# Patient Record
Sex: Female | Born: 1985 | Race: White | Hispanic: No | Marital: Married | Smoking: Former smoker
Health system: Southern US, Community
[De-identification: ages and names within clinical notes are randomized; demographics above are authoritative.]

## PROBLEM LIST (undated history)

## (undated) ENCOUNTER — Inpatient Hospital Stay (HOSPITAL_COMMUNITY): Payer: Self-pay

## (undated) DIAGNOSIS — O00109 Unspecified tubal pregnancy without intrauterine pregnancy: Secondary | ICD-10-CM

## (undated) HISTORY — DX: Unspecified tubal pregnancy without intrauterine pregnancy: O00.109

---
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
## 2011-05-01 ENCOUNTER — Inpatient Hospital Stay (HOSPITAL_COMMUNITY)
Admission: AD | Admit: 2011-05-01 | Discharge: 2011-05-02 | Disposition: A | Discharge status: Home / Self Care | Payer: BC Managed Care – PPO | Source: Ambulatory Visit | Attending: Obstetrics & Gynecology | Admitting: Obstetrics & Gynecology

## 2011-05-01 ENCOUNTER — Inpatient Hospital Stay (HOSPITAL_COMMUNITY): Payer: BC Managed Care – PPO

## 2011-05-01 ENCOUNTER — Encounter (HOSPITAL_COMMUNITY): Payer: Self-pay | Admitting: *Deleted

## 2011-05-01 ENCOUNTER — Ambulatory Visit (INDEPENDENT_AMBULATORY_CARE_PROVIDER_SITE_OTHER): Payer: BC Managed Care – PPO | Admitting: Family Medicine

## 2011-05-01 VITALS — BP 102/65 | HR 100 | Temp 98.6°F | Resp 18 | Ht 65.5 in | Wt 131.0 lb

## 2011-05-01 DIAGNOSIS — N912 Amenorrhea, unspecified: Secondary | ICD-10-CM

## 2011-05-01 DIAGNOSIS — O209 Hemorrhage in early pregnancy, unspecified: Secondary | ICD-10-CM

## 2011-05-01 DIAGNOSIS — O00109 Unspecified tubal pregnancy without intrauterine pregnancy: Secondary | ICD-10-CM | POA: Insufficient documentation

## 2011-05-01 LAB — CBC
HCT: 33.7 % — ABNORMAL LOW (ref 36.0–46.0)
Hemoglobin: 11.2 g/dL — ABNORMAL LOW (ref 12.0–15.0)
MCH: 30.5 pg (ref 26.0–34.0)
MCHC: 33.2 g/dL (ref 30.0–36.0)
MCV: 91.8 fL (ref 78.0–100.0)
Platelets: 266 10*3/uL (ref 150–400)
RBC: 3.67 MIL/uL — ABNORMAL LOW (ref 3.87–5.11)
RDW: 12.1 % (ref 11.5–15.5)
WBC: 6.2 10*3/uL (ref 4.0–10.5)

## 2011-05-01 LAB — HCG, QUANTITATIVE, PREGNANCY: hCG, Beta Chain, Quant, S: 106 m[IU]/mL — ABNORMAL HIGH

## 2011-05-01 LAB — ABO/RH: ABO/RH(D): A POS

## 2011-05-01 LAB — POCT URINE PREGNANCY: Preg Test, Ur: POSITIVE

## 2011-05-01 MED ORDER — METRONIDAZOLE 500 MG PO TABS: 500.0000 mg | ORAL_TABLET | Freq: Two times a day (BID) | ORAL | Status: DC

## 2011-05-01 MED ORDER — DOXYCYCLINE HYCLATE 100 MG PO TABS: 100.0000 mg | ORAL_TABLET | Freq: Two times a day (BID) | ORAL | Status: DC

## 2011-05-01 NOTE — MAU Note (Signed)
Pt states she went to MD's office to confirm pregnancy test , because she also had some pelvic pain

## 2011-05-01 NOTE — MAU Note (Signed)
Pt sent over from urgent care with positive preg test and bleeding.

## 2011-05-01 NOTE — MAU Provider Note (Signed)
Chief Complaint:  Abdominal Pain   Sheila Salinas is  26 y.o. G1P0.  Patient's last menstrual period was 03/31/2011.Marland Kitchen  Her pregnancy status is positive.   She presents complaining of Abdominal Pain  Pt was sent from PCP secondary to confirmed urine pregnancy in office with vaginal spotting and occasional mild cramping  Obstetrical/Gynecological History: OB History    Grav Para Term Preterm Abortions TAB SAB Ect Mult Living   1               Past Medical History: No past medical history on file.  Past Surgical History: No past surgical history on file.  Family History: No family history on file.  Social History: History  Substance Use Topics  . Smoking status: Former Games developer  . Smokeless tobacco: Not on file  . Alcohol Use: Not on file    Allergies:  Allergies  Allergen Reactions  . Morphine And Related Nausea And Vomiting    Prescriptions prior to admission  Medication Sig Dispense Refill  . norethindrone (NORA-BE) 0.35 MG tablet Take 1 tablet by mouth daily.      . Prenatal Vit-Fe Fumarate-FA (PRENATAL MULTIVITAMIN) TABS Take 1 tablet by mouth daily.        Review of Systems - Negative except what has been reviewed in HPI  Physical Exam   Blood pressure 105/65, pulse 83, temperature 98.8 F (37.1 C), resp. rate 18, height 5' 5.5" (1.664 m), weight 131 lb (59.421 kg), last menstrual period 03/31/2011.  General: General appearance - alert, well appearing, and in no distress, oriented to person, place, and time and normal appearing weight Mental status - alert, oriented to person, place, and time, normal mood, behavior, speech, dress, motor activity, and thought processes, affect appropriate to mood Focused Gynecological Exam: examination not indicated  Labs: Recent Results (from the past 24 hour(s))  POCT URINE PREGNANCY   Collection Time   05/01/11  7:33 PM      Component Value Range   Preg Test, Ur Positive    CBC   Collection Time   05/01/11  8:30 PM    Component Value Range   WBC 6.2  4.0 - 10.5 (K/uL)   RBC 3.67 (*) 3.87 - 5.11 (MIL/uL)   Hemoglobin 11.2 (*) 12.0 - 15.0 (g/dL)   HCT 16.1 (*) 09.6 - 46.0 (%)   MCV 91.8  78.0 - 100.0 (fL)   MCH 30.5  26.0 - 34.0 (pg)   MCHC 33.2  30.0 - 36.0 (g/dL)   RDW 04.5  40.9 - 81.1 (%)   Platelets 266  150 - 400 (K/uL)  HCG, QUANTITATIVE, PREGNANCY   Collection Time   05/01/11  8:30 PM      Component Value Range   hCG, Beta Chain, Quant, S 106 (*) <5 (mIU/mL)  ABO/RH   Collection Time   05/01/11  8:30 PM      Component Value Range   ABO/RH(D) A POS    COMPREHENSIVE METABOLIC PANEL   Collection Time   05/01/11  8:30 PM      Component Value Range   Sodium 136  135 - 145 (mEq/L)   Potassium 3.9  3.5 - 5.1 (mEq/L)   Chloride 103  96 - 112 (mEq/L)   CO2 26  19 - 32 (mEq/L)   Glucose, Bld 103 (*) 70 - 99 (mg/dL)   BUN 13  6 - 23 (mg/dL)   Creatinine, Ser 9.14  0.50 - 1.10 (mg/dL)   Calcium 78.2  8.4 -  10.5 (mg/dL)   Total Protein 6.5  6.0 - 8.3 (g/dL)   Albumin 4.0  3.5 - 5.2 (g/dL)   AST 15  0 - 37 (U/L)   ALT 11  0 - 35 (U/L)   Alkaline Phosphatase 35 (*) 39 - 117 (U/L)   Total Bilirubin 0.2 (*) 0.3 - 1.2 (mg/dL)   GFR calc non Af Amer >90  >90 (mL/min)   GFR calc Af Amer >90  >90 (mL/min)   Imaging Studies:  US Ob Comp Less 14 Wks  05/01/2011  *RADIOLOGY REPORT*  Clinical Data: Bleeding  OBSTETRIC <14 WK [place] AND TRANSVAGINAL OB US  Technique:  Both transabdominal and transvaginal ultrasound examinations were performed for complete evaluation of the gestation as well as the maternal uterus, adnexal regions, and pelvic cul-de-sac.  Transvaginal technique was performed to assess early pregnancy.  Comparison:  None.  Intrauterine gestational sac:  Not visualized Yolk sac: Not visualized Embryo: Not visualized Cardiac Activity: Not applicable Heart Rate: Not applicable bpm  Maternal uterus/adnexae: The right adnexa is within normal limits as the right ovary has a normal appearance.  Within the  left adnexa, the left ovary is within normal limits containing a corpus luteum cyst.  Separate from the left ovary and adjacent, there is a 9 mm structure consisting of an echogenic rim and hypoechoic center.  This finding is suspicious for a tubal ring.  There is no significant vascularity on power Doppler imaging.  Trace free fluid.  Within the uterus, there is no gestational sac but the endometrium is thickened and heterogeneous.  IMPRESSION: There is no intrauterine gestational sac.  The endometrium is heterogeneous and thickened.  There is a structure within the left adnexa is separate from the ovary, 9 mm which may represent a tubal ring from ectopic pregnancy however this cannot be confirmed.  This is an indeterminate finding.  Follow-up beta HCG levels and imaging is recommended in 2- 3 days.  Original Report Authenticated By: Donavan Burnet, M.D.    Assessment: Highly Suspicious for Ectopic Pregnancy  Plan: Patient was counselled regarding [place] highly suspicious for ectopic pregnancy and risk for rupture. Pregnancy unplanned but desired. Management options reviewed as MTX treatment now or close observation of quant in 48 hour. Pt and husband discussed privately and decided to receive MTX tonight in MAU.   MTX given. Precautions reviewed and Pt to FU on Sunday for repeat quant.  Amazin Pincock E. 05/02/2011,12:31 AM

## 2011-05-01 NOTE — Progress Notes (Addendum)
  Patient Name: Sheila Salinas Date of Birth: December 25, 1985 Medical Record Number: [id number] Gender: female Date of Encounter: 05/01/2011  History of Present Illness:  Sheila Salinas is a 26 y.o. very pleasant female patient who presents with the following:  She has taken 2 home pregnancy tests and both were positive.  She took the home tests earlier this week- she has been taking an OCP regularly and stopped taking her pills when she had the positive test.  Her last period was sometime last month but she is really not sure when.  She has noted some mild cramping "like she might get her period" over the last 2 or 3 days. She also noted a "peach" color on the tissue when she wiped the last 2 days- however she has not noted any true bleeding.  This is her 2nd pregnancy- her last was 4 years ago- I did not ask her if she had a miscarriage or TAB as she seemed embarrassed to talk about it.  She is generally healthy.    She is here today with her husband- they were not really planning a pregnancy but they are pleased.    There is no problem list on file for this patient.  No past medical history on file. No past surgical history on file. History  Substance Use Topics  . Smoking status: Former Games developer  . Smokeless tobacco: Not on file  . Alcohol Use: Not on file   No family history on file. Allergies  Allergen Reactions  . Morphine And Related    Medication list has been reviewed and updated.  Review of Systems: As per HPI- otherwise negative.  Physical Examination: Filed Vitals:   05/01/11 1917  BP: 102/65  Pulse: 100  Temp: 98.6 F (37 C)  TempSrc: Oral  Resp: 18  Height: 5' 5.5" (1.664 m)  Weight: 131 lb (59.421 kg)    Body mass index is 21.47 kg/(m^2).  GEN: WDWN, NAD, Non-toxic, A & O x 3 HEENT: Atraumatic, Normocephalic. Neck supple. No masses, No LAD. Ears and Nose: No external deformity. CV: RRR, No M/G/R. No JVD. No thrill. No extra heart sounds. PULM: CTA B, no  wheezes, crackles, rhonchi. No retractions. No resp. distress. No accessory muscle use. ABD: S, NT, ND. No rebound. No HSM.  No palpable uterus GU: normal external genitalia.  Speculum exam reveals dark blood in vaginal vault, cervix visually closed.   EXTR: No c/c/e NEURO Normal gait.  PSYCH: Normally interactive. Conversant. Not depressed or anxious appearing.  Calm demeanor.    Results for orders placed in visit on 05/01/11  POCT URINE PREGNANCY      Component Value Range   Preg Test, Ur Positive      Assessment and Plan: 1. Amenorrhea  POCT urine pregnancy  2. Bleeding in early pregnancy     Sheila Salinas has bleeding in early pregnancy.  After a phone consultation with NP at [facility] decided to have her evaluated to rule- out ectopic and assess her current pregnancy. Her husband will drive her there- given directions  Reviewed [facility] chart- she actually did have an ectopic pregnancy and was given injection of methotrexate.  Called her today to check on her- she is doing ok, will call if we can do anything to help.

## 2011-05-02 LAB — COMPREHENSIVE METABOLIC PANEL
ALT: 11 U/L (ref 0–35)
AST: 15 U/L (ref 0–37)
Albumin: 4 g/dL (ref 3.5–5.2)
Alkaline Phosphatase: 35 U/L — ABNORMAL LOW (ref 39–117)
CO2: 26 mEq/L (ref 19–32)
Chloride: 103 mEq/L (ref 96–112)
Creatinine, Ser: 0.65 mg/dL (ref 0.50–1.10)
GFR calc non Af Amer: >90 mL/min (ref >90)
Potassium: 3.9 mEq/L (ref 3.5–5.1)
Total Bilirubin: 0.2 mg/dL — ABNORMAL LOW (ref 0.3–1.2)

## 2011-05-02 MED ORDER — METHOTREXATE INJECTION FOR WOMEN'S HOSPITAL
50.0000 mg/m2 | Freq: Once | INTRAMUSCULAR | Status: AC
  Administered 2011-05-02: 85 mg via INTRAMUSCULAR
  Filled 2011-05-02: qty 1.7

## 2011-05-02 NOTE — Progress Notes (Signed)
MTX injection reviewed with pt. Pt given comfort pillow and resource information. Pt and husband acknowledged same and hugs exchanged.

## 2011-05-02 NOTE — MAU Provider Note (Signed)
Attestation of Attending Supervision of Resident: Evaluation and management procedures were performed by the [facility] Resident under my supervision.  I have reviewed the resident's note and chart, and I agree with management and plan.  Jaynie Collins, M.D. 05/02/2011 12:53 AM

## 2011-05-02 NOTE — Discharge Instructions (Signed)
Methotrexate Treatment for an Ectopic Pregnancy  An ectopic pregnancy is when the fertilized egg attaches (implants) outside the uterus. Most ectopic pregnancies occur in the fallopian tube. Rarely do ectopic pregnancies occur on the ovary, intestine, pelvis, or cervix. An ectopic pregnancy does not have the ability to develop into a normal, healthy baby. Having an ectopic pregnancy can be a life-threatening experience. However, if the ectopic pregnancy is found early enough, it can be treated with a medicine. This medicine is called methotrexate. Methotrexate works by stopping the pregnancy from growing. It helps the body absorb the pregnancy tissue over a 2 to 6 week period (though most pregnancies will be absorbed by 3 weeks).   If methotrexate is successful, there is a good chance that the fallopian tube may be saved. Regardless of whether the fallopian tube is saved, a mother who has had an ectopic pregnancy is at a much higher risk of having another ectopic occur in future pregnancies. One serious concern is the potential for the fallopian tube to tear (rupture). If it does, emergency surgery is needed to remove the pregnancy, and methotrexate cannot be used.  The ideal patient for methotrexate is a person who is:    Not bleeding internally.   Has no severe or persistent abdominal pain.   Is committed to following through with lab tests and appointments until the ectopic has absorbed.   Is healthy and has normal liver and kidney functions on evaluation.  Methotrexate should not be given to women who:   Are breastfeeding.   Have a normal pregnancy (intrauterine pregnancy).   Have liver, lung, or kidney disease.   Have blood problems.   Are allergic to methotrexate.   Have peptic ulcers.   Have an ectopic pregnancy larger than 1 inches (3.5 cm) or one that has fetal heartbeats. This is a rule that is followed most of the time (relative contraindication).  BEFORE THE TREATMENT  Before giving the  medicine:   Liver tests, kidney tests, and a complete blood test are performed.   Blood tests are performed to measure the pregnancy hormone levels and to determine the mother's blood type.   If the woman is Rh negative, and the father is Rh positive or his Rh type is not known, a RhoGAM shot is given.  TREATMENT   There are 2 methods that your caregiver may use to prescribe methotrexate. One method involves a single dose or injection of the medicine. Another method involves a series of doses. This method involves several injections.   AFTER THE TREATMENT  Blood tests will be taken for several weeks to check the pregnancy hormone levels. The blood tests are performed until there is no more pregnancy hormone detected in the blood. There is still a risk of the ectopic pregnancy rupturing while using the methotrexate. There are also side effects of methotrexate, which include:    Nausea and vomiting.   Mouth sores.   Diarrhea.   Rash.   Dizziness.   Increased abdominal pain.   Increased vaginal bleeding or spotting.   Pneumonia.   Failed treatment.   Hair loss. This is rare and reversible.  On very rare occasions, the medicine may affect your blood counts, liver, kidney, bone marrow, or hormone levels. If this happens, your caregiver will want to perform further evaluations.  Document Released: 01/28/2001 Document Revised: 01/23/2011 Document Reviewed: 10/10/2010  Exit[employer] Patient Information 2012 [employer].

## 2011-05-04 ENCOUNTER — Inpatient Hospital Stay (HOSPITAL_COMMUNITY): Admit: 2011-05-04 | Payer: Self-pay

## 2011-05-06 ENCOUNTER — Inpatient Hospital Stay (HOSPITAL_COMMUNITY)
Admission: AD | Admit: 2011-05-06 | Discharge: 2011-05-06 | Disposition: A | Discharge status: Home / Self Care | Payer: BC Managed Care – PPO | Source: Ambulatory Visit | Attending: Obstetrics and Gynecology | Admitting: Obstetrics and Gynecology

## 2011-05-06 ENCOUNTER — Encounter: Payer: Self-pay | Admitting: Physician Assistant

## 2011-05-06 DIAGNOSIS — O00109 Unspecified tubal pregnancy without intrauterine pregnancy: Secondary | ICD-10-CM

## 2011-05-06 DIAGNOSIS — Z8759 Personal history of other complications of pregnancy, childbirth and the puerperium: Secondary | ICD-10-CM

## 2011-05-06 DIAGNOSIS — Z8742 Personal history of other diseases of the female genital tract: Secondary | ICD-10-CM

## 2011-05-06 HISTORY — DX: Unspecified tubal pregnancy without intrauterine pregnancy: O00.109

## 2011-05-06 NOTE — MAU Note (Signed)
Patient to MAU for BHCG s/p MTX. MTX was given on 3-14 and patient was unable to return on day 4, is also unable to return tomorrow for day 7 and labs drawn today. Reports some cramping on and off but none now and continues to have period like bleeding.

## 2011-05-06 NOTE — Discharge Instructions (Signed)
Methotrexate Treatment for an Ectopic Pregnancy  An ectopic pregnancy is when the fertilized egg attaches (implants) outside the uterus. Most ectopic pregnancies occur in the fallopian tube. Rarely do ectopic pregnancies occur on the ovary, intestine, pelvis, or cervix. An ectopic pregnancy does not have the ability to develop into a normal, healthy baby. Having an ectopic pregnancy can be a life-threatening experience. However, if the ectopic pregnancy is found early enough, it can be treated with a medicine. This medicine is called methotrexate. Methotrexate works by stopping the pregnancy from growing. It helps the body absorb the pregnancy tissue over a 2 to 6 week period (though most pregnancies will be absorbed by 3 weeks).   If methotrexate is successful, there is a good chance that the fallopian tube may be saved. Regardless of whether the fallopian tube is saved, a mother who has had an ectopic pregnancy is at a much higher risk of having another ectopic occur in future pregnancies. One serious concern is the potential for the fallopian tube to tear (rupture). If it does, emergency surgery is needed to remove the pregnancy, and methotrexate cannot be used.  The ideal patient for methotrexate is a person who is:    Not bleeding internally.   Has no severe or persistent abdominal pain.   Is committed to following through with lab tests and appointments until the ectopic has absorbed.   Is healthy and has normal liver and kidney functions on evaluation.  Methotrexate should not be given to women who:   Are breastfeeding.   Have a normal pregnancy (intrauterine pregnancy).   Have liver, lung, or kidney disease.   Have blood problems.   Are allergic to methotrexate.   Have peptic ulcers.   Have an ectopic pregnancy larger than 1 inches (3.5 cm) or one that has fetal heartbeats. This is a rule that is followed most of the time (relative contraindication).  BEFORE THE TREATMENT  Before giving the  medicine:   Liver tests, kidney tests, and a complete blood test are performed.   Blood tests are performed to measure the pregnancy hormone levels and to determine the mother's blood type.   If the woman is Rh negative, and the father is Rh positive or his Rh type is not known, a RhoGAM shot is given.  TREATMENT   There are 2 methods that your caregiver may use to prescribe methotrexate. One method involves a single dose or injection of the medicine. Another method involves a series of doses. This method involves several injections.   AFTER THE TREATMENT  Blood tests will be taken for several weeks to check the pregnancy hormone levels. The blood tests are performed until there is no more pregnancy hormone detected in the blood. There is still a risk of the ectopic pregnancy rupturing while using the methotrexate. There are also side effects of methotrexate, which include:    Nausea and vomiting.   Mouth sores.   Diarrhea.   Rash.   Dizziness.   Increased abdominal pain.   Increased vaginal bleeding or spotting.   Pneumonia.   Failed treatment.   Hair loss. This is rare and reversible.  On very rare occasions, the medicine may affect your blood counts, liver, kidney, bone marrow, or hormone levels. If this happens, your caregiver will want to perform further evaluations.  Document Released: 01/28/2001 Document Revised: 01/23/2011 Document Reviewed: 10/10/2010  Exit[employer] Patient Information 2012 [employer].

## 2011-05-06 NOTE — MAU Provider Note (Signed)
History   Chief Complaint:  Follow-up  Sheila Salinas is  26 y.o. G1P0 Patient's last menstrual period was 03/31/2011.Marland Kitchen Patient is here for follow up of quantitative HCG and ongoing surveillance of pregnancy status following MTX.     Since her last visit, the patient is without new complaint.   The patient reports bleeding as  none now.    General ROS:  negative  Her previous Quantitative HCG values are: 3/14: 104     Physical Exam   Blood pressure 106/64, pulse 88, temperature 98 F (36.7 C), temperature source Oral, resp. rate 16, last menstrual period 03/31/2011.  Focused Gynecological Exam: examination not indicated  Labs: Recent Results (from the past 24 hour(s))  HCG, QUANTITATIVE, PREGNANCY   Collection Time   05/06/11  9:05 AM      Component Value Range   hCG, Beta Chain, Quant, S 83 (*) <5 (mIU/mL)    Ultrasound Studies:   US Ob Comp Less 14 Wks  05/01/2011  *RADIOLOGY REPORT*  Clinical Data: Bleeding  OBSTETRIC <14 WK [place] AND TRANSVAGINAL OB US  Technique:  Both transabdominal and transvaginal ultrasound examinations were performed for complete evaluation of the gestation as well as the maternal uterus, adnexal regions, and pelvic cul-de-sac.  Transvaginal technique was performed to assess early pregnancy.  Comparison:  None.  Intrauterine gestational sac:  Not visualized Yolk sac: Not visualized Embryo: Not visualized Cardiac Activity: Not applicable Heart Rate: Not applicable bpm  Maternal uterus/adnexae: The right adnexa is within normal limits as the right ovary has a normal appearance.  Within the left adnexa, the left ovary is within normal limits containing a corpus luteum cyst.  Separate from the left ovary and adjacent, there is a 9 mm structure consisting of an echogenic rim and hypoechoic center.  This finding is suspicious for a tubal ring.  There is no significant vascularity on power Doppler imaging.  Trace free fluid.  Within the uterus, there is no gestational  sac but the endometrium is thickened and heterogeneous.  IMPRESSION: There is no intrauterine gestational sac.  The endometrium is heterogeneous and thickened.  There is a structure within the left adnexa is separate from the ovary, 9 mm which may represent a tubal ring from ectopic pregnancy however this cannot be confirmed.  This is an indeterminate finding.  Follow-up beta HCG levels and imaging is recommended in 2- 3 days.  Original Report Authenticated By: Donavan Burnet, M.D.   Assessment:  Ectopic Pregnancy s/o MTX Appropriately falling quants  Plan: The patient is instructed to follow up in in 7 days. In Gyn clinic.  Appt made in Gyn Clinic for 05/13/10 @ 8:30am (lab only)  Puja Caffey E. 05/06/2011, 9:39 AM

## 2011-05-13 ENCOUNTER — Other Ambulatory Visit: Payer: BC Managed Care – PPO

## 2011-05-13 DIAGNOSIS — O021 Missed abortion: Secondary | ICD-10-CM

## 2011-05-13 DIAGNOSIS — Z8759 Personal history of other complications of pregnancy, childbirth and the puerperium: Secondary | ICD-10-CM

## 2011-05-19 ENCOUNTER — Telehealth: Payer: Self-pay | Admitting: *Deleted

## 2011-05-19 NOTE — Telephone Encounter (Signed)
Called pt and left message that she will need another hormone level drawn this week. Please call to schedule.

## 2011-05-22 NOTE — Telephone Encounter (Signed)
Called pt and left additional message that although her previous hormone level had decreased, she needs repeat hormone level drawn this week. Please call to schedule appt. She may leave a message on nurse voice mail if she has questions.

## 2011-05-26 NOTE — MAU Provider Note (Signed)
Agree with above note.  Sheila Salinas 05/26/2011 10:22 AM   

## 2011-09-29 ENCOUNTER — Ambulatory Visit (INDEPENDENT_AMBULATORY_CARE_PROVIDER_SITE_OTHER): Payer: 59 | Admitting: Physician Assistant

## 2011-09-29 VITALS — BP 97/64 | HR 73 | Temp 98.3°F | Resp 18 | Ht 65.0 in | Wt 126.6 lb

## 2011-09-29 DIAGNOSIS — S91309A Unspecified open wound, unspecified foot, initial encounter: Secondary | ICD-10-CM

## 2011-09-29 DIAGNOSIS — Z23 Encounter for immunization: Secondary | ICD-10-CM

## 2011-09-29 NOTE — Progress Notes (Signed)
  Subjective:    Patient ID: Sheila Salinas, female    DOB: 01/27/1986, 26 y.o.   MRN: [id number]  HPI  26 year old female presents for Tdap vaccination. States on 8/10 she stepped on the head of a nail while walking barefoot.  She says it did break the skin and is still slightly sore, but otherwise healing well.  Denies any erythema, warmth, or drainage.  Is slightly painful to touch.      Review of Systems  All other systems reviewed and are negative.       Objective:   Physical Exam  Constitutional: She is oriented to person, place, and time. She appears well-developed and well-nourished.  HENT:  Head: Normocephalic and atraumatic.  Right Ear: External ear normal.  Left Ear: External ear normal.  Eyes: Conjunctivae are normal.  Neck: Normal range of motion.  Cardiovascular: Normal rate.   Pulmonary/Chest: Effort normal.  Neurological: She is alert and oriented to person, place, and time.  Skin:       Well healing wound on plantar surface of right foot. No erythema, warmth, or drainage.    Psychiatric: She has a normal mood and affect. Her behavior is normal. Judgment and thought content normal.          Assessment & Plan:   1. Need for Tdap vaccination  Tdap vaccine greater than or equal to 7yo IM  Will update tetanus today.  Wound healing well - no need for antibiotic therapy at this time. Follow up with any worsening or changing symptoms.

## 2012-01-20 ENCOUNTER — Other Ambulatory Visit: Payer: Self-pay

## 2012-02-07 ENCOUNTER — Telehealth: Payer: Self-pay

## 2012-02-07 NOTE — Telephone Encounter (Signed)
Pt has questions about her pregnancy. Please call [phone]

## 2012-02-08 NOTE — Telephone Encounter (Signed)
lmom to cb. 

## 2012-02-12 NOTE — Telephone Encounter (Signed)
Called patient , unable to leave message

## 2012-02-13 NOTE — Telephone Encounter (Signed)
Called again 3rd attempt. Left another message if she needs anything to call me back.

## 2012-02-18 NOTE — L&D Delivery Note (Addendum)
Delivery Note At 7:35 PM a viable and healthy female was delivered after 4 hours in the second stage via Kiwi vacuum extractor (Presentation: Left Occiput Anterior).  APGAR: 9, 9; weight 8 lbs 9 oz.   Placenta status: Intact, Spontaneous.  Cord: 3 vessels.  Anesthesia: Local for repair of lacerations  Episiotomy: None Lacerations: Right labia minora and Left vaginal/perineal tears repaired. Suture Repair: 3.0 chromic Est. Blood Loss (mL): 380  Mom to postpartum.  Baby to nursery-stable.  Shelby Peltz D 07/30/2012, 8:30 PM

## 2012-05-06 ENCOUNTER — Other Ambulatory Visit (HOSPITAL_COMMUNITY): Payer: Self-pay | Admitting: Obstetrics and Gynecology

## 2012-05-06 DIAGNOSIS — O2690 Pregnancy related conditions, unspecified, unspecified trimester: Secondary | ICD-10-CM

## 2012-05-13 ENCOUNTER — Ambulatory Visit (HOSPITAL_COMMUNITY): Admission: RE | Admit: 2012-05-13 | Payer: 59 | Source: Ambulatory Visit

## 2012-05-13 ENCOUNTER — Ambulatory Visit (HOSPITAL_COMMUNITY)
Admission: RE | Admit: 2012-05-13 | Discharge: 2012-05-13 | Disposition: A | Discharge status: Home / Self Care | Payer: 59 | Source: Ambulatory Visit | Attending: Obstetrics and Gynecology | Admitting: Obstetrics and Gynecology

## 2012-05-13 ENCOUNTER — Encounter (HOSPITAL_COMMUNITY): Payer: Self-pay

## 2012-05-13 VITALS — BP 114/66 | HR 110 | Wt 147.8 lb

## 2012-05-13 DIAGNOSIS — Z1389 Encounter for screening for other disorder: Secondary | ICD-10-CM | POA: Insufficient documentation

## 2012-05-13 DIAGNOSIS — Z363 Encounter for antenatal screening for malformations: Secondary | ICD-10-CM | POA: Insufficient documentation

## 2012-05-13 DIAGNOSIS — O352XX0 Maternal care for (suspected) hereditary disease in fetus, not applicable or unspecified: Secondary | ICD-10-CM | POA: Insufficient documentation

## 2012-05-13 DIAGNOSIS — O358XX0 Maternal care for other (suspected) fetal abnormality and damage, not applicable or unspecified: Secondary | ICD-10-CM | POA: Insufficient documentation

## 2012-05-13 DIAGNOSIS — O2690 Pregnancy related conditions, unspecified, unspecified trimester: Secondary | ICD-10-CM

## 2012-05-13 NOTE — Progress Notes (Signed)
Sheila Salinas  was seen today for an ultrasound appointment.  See full report in [facility].  Comments: [name] was seen today due to a family history of congenital heart defects.  She reports that her sisted died during the first year of life due to hypoplastic left heart and pulmonary atresia.  The fetal anatomic survey today was within normal limits.  The 4CH, outflow tracts, aortic and ductal arches appeared normal.  The patient was offerred a formal fetal echo with Peds cardiology as well as Genetics counseling and declined.  Impression: Single IUP at 281/7 weeks Normal detailed fetal anatomy The fetal heart was well-visualized and appears normal Fetal growth is appropriate (74th %tile) Normal amniotic fluid volume  Recommendations: Follow-up ultrasounds as clinically indicated.  Alpha Gula, MD

## 2012-06-03 ENCOUNTER — Encounter (HOSPITAL_COMMUNITY): Payer: Self-pay | Admitting: *Deleted

## 2012-06-03 ENCOUNTER — Inpatient Hospital Stay (HOSPITAL_COMMUNITY)
Admission: AD | Admit: 2012-06-03 | Discharge: 2012-06-03 | Disposition: A | Discharge status: Home / Self Care | Payer: 59 | Source: Ambulatory Visit | Attending: Obstetrics and Gynecology | Admitting: Obstetrics and Gynecology

## 2012-06-03 DIAGNOSIS — O99891 Other specified diseases and conditions complicating pregnancy: Secondary | ICD-10-CM | POA: Insufficient documentation

## 2012-06-03 DIAGNOSIS — N949 Unspecified condition associated with female genital organs and menstrual cycle: Secondary | ICD-10-CM | POA: Insufficient documentation

## 2012-06-03 DIAGNOSIS — O26899 Other specified pregnancy related conditions, unspecified trimester: Secondary | ICD-10-CM

## 2012-06-03 DIAGNOSIS — O26893 Other specified pregnancy related conditions, third trimester: Secondary | ICD-10-CM

## 2012-06-03 DIAGNOSIS — N898 Other specified noninflammatory disorders of vagina: Secondary | ICD-10-CM

## 2012-06-03 LAB — WET PREP, GENITAL
Clue Cells Wet Prep HPF POC: NONE SEEN
Trich, Wet Prep: NONE SEEN

## 2012-06-03 NOTE — MAU Provider Note (Signed)
  History     CSN: [id number]  Arrival date and time: 06/03/12 1257   First Provider Initiated Contact with Patient 06/03/12 1344      Chief Complaint  Patient presents with  . rule of rupture of membraines    HPI  Pt is a G2P0010 here at 31.1 wks IUP here with report of clear watery discharge at around 1200 today.  Discharge soaked through pants today x 1.  History of having increased vaginal discharge, but usually thicker in nature.  No report of vaginal bleeding.  +braxton hicks that started yesterday.    Past Medical History  Diagnosis Date  . Ectopic pregnancy, tubal 05/06/2011    Tx'd with MTX in MAU    History reviewed. No pertinent past surgical history.  History reviewed. No pertinent family history.  History  Substance Use Topics  . Smoking status: Former Games developer  . Smokeless tobacco: Never Used  . Alcohol Use: Yes     Comment: ocial    Allergies:  Allergies  Allergen Reactions  . Morphine And Related Nausea And Vomiting    Prescriptions prior to admission  Medication Sig Dispense Refill  . calcium carbonate (TUMS - DOSED IN MG ELEMENTAL CALCIUM) 500 MG chewable tablet Chew 1 tablet by mouth 3 (three) times daily as needed for heartburn.      Tery Sanfilippo Sodium (COLACE PO) Take 1 tablet by mouth at bedtime.      . Prenatal Vit-Fe Fumarate-FA (PRENATAL MULTIVITAMIN) TABS Take 1 tablet by mouth daily at 12 noon.        Review of Systems  Gastrointestinal: Negative for abdominal pain.  Genitourinary:       Watery discharge  All other systems reviewed and are negative.   Physical Exam   Blood pressure 114/67, pulse 104, temperature 97.9 F (36.6 C), temperature source Oral, resp. rate 18, height 5\' 4"  (1.626 m), weight 68.947 kg (152 lb), last menstrual period 10/29/2011.  Physical Exam  Constitutional: She is oriented to person, place, and time. She appears well-developed and well-nourished. No distress.  HENT:  Head: Normocephalic.  Neck: Normal  range of motion. Neck supple.  Cardiovascular: Normal rate, regular rhythm and normal heart sounds.   Respiratory: Effort normal and breath sounds normal.  GI: Soft. There is no tenderness.  Genitourinary: No bleeding around the vagina. Vaginal discharge (mucus-like thick; no pooling, fern negative) found.  Neurological: She is alert and oriented to person, place, and time.  Skin: Skin is warm and dry.   FHR 130's, +accels, reactive Toco - none Dilation: Closed Effacement (%): Thick Cervical Position: Posterior Station: Ballotable Exam by:: Roney Marion, CNM  MAU Course  Procedures  Consulted with Dr. Claiborne Billings > reviewed HPI/Exam > discharge to home Assessment and Plan  Physiologic Vaginal Discharge Category I FHR Tracing  Plan: DC to home Provide reassurance Keep scheduled appointment  [facility] 06/03/2012, 1:50 PM

## 2012-06-03 NOTE — MAU Note (Signed)
Registered, went to restroom.  Had a small gush around noon.  Asked if it was still coming, pt stated "it's not like that"

## 2012-06-03 NOTE — MAU Note (Signed)
Went to the bathroom, after had "a discharge".  Pants were really wet, creamy d/c noted in underwear.  No bleeding.

## 2012-06-17 ENCOUNTER — Encounter (HOSPITAL_COMMUNITY): Payer: Self-pay | Admitting: *Deleted

## 2012-06-17 ENCOUNTER — Inpatient Hospital Stay (HOSPITAL_COMMUNITY)
Admission: AD | Admit: 2012-06-17 | Discharge: 2012-06-17 | Disposition: A | Discharge status: Home / Self Care | Payer: 59 | Source: Ambulatory Visit | Attending: Obstetrics and Gynecology | Admitting: Obstetrics and Gynecology

## 2012-06-17 DIAGNOSIS — O4700 False labor before 37 completed weeks of gestation, unspecified trimester: Secondary | ICD-10-CM | POA: Insufficient documentation

## 2012-06-17 DIAGNOSIS — O479 False labor, unspecified: Secondary | ICD-10-CM

## 2012-06-17 DIAGNOSIS — O4703 False labor before 37 completed weeks of gestation, third trimester: Secondary | ICD-10-CM

## 2012-06-17 LAB — URINALYSIS, ROUTINE W REFLEX MICROSCOPIC
Hgb urine dipstick: NEGATIVE
Leukocytes, UA: NEGATIVE
Nitrite: NEGATIVE
Protein, ur: NEGATIVE mg/dL
Specific Gravity, Urine: 1.015 (ref 1.005–1.030)
Urobilinogen, UA: 0.2 mg/dL (ref 0.0–1.0)

## 2012-06-17 LAB — FETAL FIBRONECTIN: Fetal Fibronectin: NEGATIVE

## 2012-06-17 MED ORDER — NIFEDIPINE 10 MG PO CAPS: 10.0000 mg | ORAL_CAPSULE | ORAL | Status: DC | PRN

## 2012-06-17 MED ORDER — NIFEDIPINE 10 MG PO CAPS
10.0000 mg | ORAL_CAPSULE | ORAL | Status: DC | PRN
  Administered 2012-06-17 (×2): 10 mg via ORAL
  Filled 2012-06-17 (×2): qty 1

## 2012-06-17 NOTE — MAU Note (Signed)
Patient states she has been having irregular mild contractions since this am. Has had 9 contractions in 45 minutes. Has had mild cramping. Denies bleeding or leaking and reports good fetal movement.

## 2012-06-17 NOTE — MAU Provider Note (Signed)
History     CSN: [id number]  Arrival date and time: 06/17/12 1621   First Provider Initiated Contact with Patient 06/17/12 1737      Chief Complaint  Patient presents with  . Labor Eval   HPI  Pt is a G2P0010 at 33.1 wks IUP here with report of contractions that started last night that has continued until today.  Denies rupture of membranes or vaginal bleeding.  No report of sexual intercourse in past 48 hours.    Past Medical History  Diagnosis Date  . Ectopic pregnancy, tubal 05/06/2011    Tx'd with MTX in MAU    History reviewed. No pertinent past surgical history.  History reviewed. No pertinent family history.  History  Substance Use Topics  . Smoking status: Former Smoker -- 1.00 packs/day    Quit date: 06/18/2010  . Smokeless tobacco: Never Used  . Alcohol Use: Yes     Comment: ocial    Allergies:  Allergies  Allergen Reactions  . Morphine And Related Nausea And Vomiting    Prescriptions prior to admission  Medication Sig Dispense Refill  . calcium carbonate (TUMS - DOSED IN MG ELEMENTAL CALCIUM) 500 MG chewable tablet Chew 1 tablet by mouth 3 (three) times daily as needed for heartburn.      Tery Sanfilippo Sodium (COLACE PO) Take 1 tablet by mouth at bedtime.      . Prenatal Vit-Fe Fumarate-FA (PRENATAL MULTIVITAMIN) TABS Take 1 tablet by mouth daily at 12 noon.        Review of Systems  Gastrointestinal: Positive for abdominal pain (contractions).  All other systems reviewed and are negative.   Physical Exam   Blood pressure 107/70, pulse 96, temperature 98.3 F (36.8 C), temperature source Oral, resp. rate 16, height 5\' 4"  (1.626 m), weight 151 lb 12.8 oz (68.856 kg), last menstrual period 10/29/2011, SpO2 100.00%.  Physical Exam  Constitutional: She is oriented to person, place, and time. She appears well-developed and well-nourished. No distress.  HENT:  Head: Normocephalic.  Neck: Normal range of motion. Neck supple.  Cardiovascular: Normal  rate, regular rhythm and normal heart sounds.   Respiratory: Effort normal and breath sounds normal.  GI: Soft. There is no tenderness.  Genitourinary: No bleeding around the vagina. Vaginal discharge: minimal discharge.  Neurological: She is alert and oriented to person, place, and time.  Skin: Skin is warm and dry.   FHR 130's, +accels, reactive Toco - every 3-8 min   MAU Course  Procedures Procardia 10mg  PO x 2 > reports improvement in pain > contractions spaced   Results for orders placed during the hospital encounter of 06/17/12 (from the past 24 hour(s))  URINALYSIS, ROUTINE W REFLEX MICROSCOPIC     Status: None   Collection Time    06/17/12  4:35 PM      Result Value Range   Color, Urine YELLOW  YELLOW   APPearance CLEAR  CLEAR   Specific Gravity, Urine 1.015  1.005 - 1.030   pH 6.5  5.0 - 8.0   Glucose, UA NEGATIVE  NEGATIVE mg/dL   Hgb urine dipstick NEGATIVE  NEGATIVE   Bilirubin Urine NEGATIVE  NEGATIVE   Ketones, ur NEGATIVE  NEGATIVE mg/dL   Protein, ur NEGATIVE  NEGATIVE mg/dL   Urobilinogen, UA 0.2  0.0 - 1.0 mg/dL   Nitrite NEGATIVE  NEGATIVE   Leukocytes, UA NEGATIVE  NEGATIVE  FETAL FIBRONECTIN     Status: None   Collection Time    06/17/12  5:45 PM      Result Value Range   Fetal Fibronectin NEGATIVE  NEGATIVE    Assessment and Plan  Preterm Contraction - No cervical dilation Consulted with Dr. Henderson Cloud > discharge to home with RX Procardia  Plan:   DC to home RX Procardia 10 mg every 4-6 hours prn contraction Preterm labor precautions  [facility] 06/17/2012, 5:47 PM

## 2012-07-30 ENCOUNTER — Encounter (HOSPITAL_COMMUNITY): Payer: Self-pay | Admitting: Family

## 2012-07-30 ENCOUNTER — Inpatient Hospital Stay (HOSPITAL_COMMUNITY)
Admission: AD | Admit: 2012-07-30 | Discharge: 2012-08-01 | DRG: 775 | Disposition: A | Discharge status: Home / Self Care | Payer: 59 | Source: Ambulatory Visit | Attending: Obstetrics & Gynecology | Admitting: Obstetrics & Gynecology

## 2012-07-30 LAB — CBC
MCV: 90 fL (ref 78.0–100.0)
Platelets: 197 10*3/uL (ref 150–400)
RBC: 3.81 MIL/uL — ABNORMAL LOW (ref 3.87–5.11)
RDW: 13.1 % (ref 11.5–15.5)
WBC: 12.5 10*3/uL — ABNORMAL HIGH (ref 4.0–10.5)

## 2012-07-30 LAB — OB RESULTS CONSOLE GC/CHLAMYDIA: Chlamydia: NEGATIVE

## 2012-07-30 LAB — ABO/RH: ABO/RH(D): A POS

## 2012-07-30 LAB — TYPE AND SCREEN: ABO/RH(D): A POS

## 2012-07-30 LAB — OB RESULTS CONSOLE RUBELLA ANTIBODY, IGM: Rubella: IMMUNE

## 2012-07-30 LAB — RPR: RPR Ser Ql: NONREACTIVE

## 2012-07-30 MED ORDER — LACTATED RINGERS IV SOLN
INTRAVENOUS | Status: DC
  Administered 2012-07-30 (×2): via INTRAVENOUS

## 2012-07-30 MED ORDER — WITCH HAZEL-GLYCERIN EX PADS: 1.0000 "application " | MEDICATED_PAD | CUTANEOUS | Status: DC | PRN

## 2012-07-30 MED ORDER — OXYCODONE-ACETAMINOPHEN 5-325 MG PO TABS
1.0000 | ORAL_TABLET | ORAL | Status: DC | PRN
  Administered 2012-07-31: 1 via ORAL
  Administered 2012-07-31: 2 via ORAL

## 2012-07-30 MED ORDER — BUTORPHANOL TARTRATE 1 MG/ML IJ SOLN
1.0000 mg | INTRAMUSCULAR | Status: DC | PRN
  Administered 2012-07-30 (×3): 1 mg via INTRAVENOUS
  Filled 2012-07-30 (×3): qty 1

## 2012-07-30 MED ORDER — SENNOSIDES-DOCUSATE SODIUM 8.6-50 MG PO TABS
2.0000 | ORAL_TABLET | Freq: Every day | ORAL | Status: DC
  Administered 2012-07-31: 2 via ORAL

## 2012-07-30 MED ORDER — ZOLPIDEM TARTRATE 5 MG PO TABS: 5.0000 mg | ORAL_TABLET | Freq: Every evening | ORAL | Status: DC | PRN

## 2012-07-30 MED ORDER — ONDANSETRON HCL 4 MG/2ML IJ SOLN: 4.0000 mg | INTRAMUSCULAR | Status: DC | PRN

## 2012-07-30 MED ORDER — OXYCODONE-ACETAMINOPHEN 5-325 MG PO TABS
1.0000 | ORAL_TABLET | ORAL | Status: DC | PRN
  Administered 2012-07-31 – 2012-08-01 (×2): 1 via ORAL
  Filled 2012-07-30 (×3): qty 1
  Filled 2012-07-30: qty 2
  Filled 2012-07-30: qty 1

## 2012-07-30 MED ORDER — CITRIC ACID-SODIUM CITRATE 334-500 MG/5ML PO SOLN
30.0000 mL | ORAL | Status: DC | PRN
  Filled 2012-07-30: qty 15

## 2012-07-30 MED ORDER — SIMETHICONE 80 MG PO CHEW: 80.0000 mg | CHEWABLE_TABLET | ORAL | Status: DC | PRN

## 2012-07-30 MED ORDER — LANOLIN HYDROUS EX OINT: TOPICAL_OINTMENT | CUTANEOUS | Status: DC | PRN

## 2012-07-30 MED ORDER — ONDANSETRON HCL 4 MG/2ML IJ SOLN: 4.0000 mg | Freq: Four times a day (QID) | INTRAMUSCULAR | Status: DC | PRN

## 2012-07-30 MED ORDER — NALOXONE HCL 0.4 MG/ML IJ SOLN
INTRAMUSCULAR | Status: AC
  Filled 2012-07-30: qty 1

## 2012-07-30 MED ORDER — BUTORPHANOL TARTRATE 1 MG/ML IJ SOLN
INTRAMUSCULAR | Status: AC
  Filled 2012-07-30: qty 2

## 2012-07-30 MED ORDER — BENZOCAINE-MENTHOL 20-0.5 % EX AERO
1.0000 "application " | INHALATION_SPRAY | CUTANEOUS | Status: DC | PRN
  Filled 2012-07-30: qty 56

## 2012-07-30 MED ORDER — PRENATAL MULTIVITAMIN CH
1.0000 | ORAL_TABLET | Freq: Every day | ORAL | Status: DC
  Administered 2012-07-31: 1 via ORAL

## 2012-07-30 MED ORDER — IBUPROFEN 600 MG PO TABS
600.0000 mg | ORAL_TABLET | Freq: Four times a day (QID) | ORAL | Status: DC | PRN
  Administered 2012-07-30 – 2012-08-01 (×3): 600 mg via ORAL
  Filled 2012-07-30 (×5): qty 1

## 2012-07-30 MED ORDER — IBUPROFEN 600 MG PO TABS
600.0000 mg | ORAL_TABLET | Freq: Four times a day (QID) | ORAL | Status: DC
  Administered 2012-07-31 – 2012-08-01 (×3): 600 mg via ORAL
  Filled 2012-07-30: qty 1

## 2012-07-30 MED ORDER — OXYTOCIN BOLUS FROM INFUSION
500.0000 mL | INTRAVENOUS | Status: DC
  Administered 2012-07-30: 500 mL via INTRAVENOUS

## 2012-07-30 MED ORDER — ONDANSETRON HCL 4 MG PO TABS: 4.0000 mg | ORAL_TABLET | ORAL | Status: DC | PRN

## 2012-07-30 MED ORDER — TETANUS-DIPHTH-ACELL PERTUSSIS 5-2.5-18.5 LF-MCG/0.5 IM SUSP: 0.5000 mL | Freq: Once | INTRAMUSCULAR | Status: DC

## 2012-07-30 MED ORDER — DIBUCAINE 1 % RE OINT: 1.0000 "application " | TOPICAL_OINTMENT | RECTAL | Status: DC | PRN

## 2012-07-30 MED ORDER — BUTORPHANOL TARTRATE 1 MG/ML IJ SOLN
2.0000 mg | Freq: Once | INTRAMUSCULAR | Status: AC
  Administered 2012-07-30: 2 mg via INTRAVENOUS

## 2012-07-30 MED ORDER — LIDOCAINE HCL (PF) 1 % IJ SOLN
30.0000 mL | INTRAMUSCULAR | Status: DC | PRN
  Administered 2012-07-30: 30 mL via SUBCUTANEOUS
  Filled 2012-07-30 (×2): qty 30

## 2012-07-30 MED ORDER — ACETAMINOPHEN 325 MG PO TABS: 650.0000 mg | ORAL_TABLET | ORAL | Status: DC | PRN

## 2012-07-30 MED ORDER — OXYTOCIN 40 UNITS IN LACTATED RINGERS INFUSION - SIMPLE MED
62.5000 mL/h | INTRAVENOUS | Status: DC
  Filled 2012-07-30: qty 1000

## 2012-07-30 MED ORDER — DIPHENHYDRAMINE HCL 25 MG PO CAPS: 25.0000 mg | ORAL_CAPSULE | Freq: Four times a day (QID) | ORAL | Status: DC | PRN

## 2012-07-30 MED ORDER — LACTATED RINGERS IV SOLN: 500.0000 mL | INTRAVENOUS | Status: DC | PRN

## 2012-07-30 NOTE — MAU Note (Signed)
Pt/family state ctx's since early this am, now 2-3 minutes apart. Water may be broke, pt unsure.

## 2012-07-30 NOTE — H&P (Signed)
27 y.o. G2P0010  Estimated Date of Delivery: 08/04/12 admitted at 39/[redacted] weeks gestation in labor.  Prenatal Transfer Tool  Maternal Diabetes: No Genetic Screening: Normal Maternal Ultrasounds/Referrals: Normal Fetal Ultrasounds or other Referrals:  Referred to Materal Fetal Medicine for family history of congenital heart disease Maternal Substance Abuse:  No Significant Maternal Medications:  None Significant Maternal Lab Results: None Other Significant Pregnancy Complications:  None  Afebrile, VSS Heart and Lungs: No active disease Abdomen: soft, gravid, EFW AGA. Cervical exam:  4/100  Impression: Labor  Plan:  Admit for delivery

## 2012-07-30 NOTE — MAU Note (Signed)
Patient presents to MAU for labor eval and possible ROM (clear fluid) at 0700. Denies vaginal bleeding. Reports +FM.

## 2012-07-31 LAB — CBC
HCT: 28.7 % — ABNORMAL LOW (ref 36.0–46.0)
MCV: 91.4 fL (ref 78.0–100.0)
RBC: 3.14 MIL/uL — ABNORMAL LOW (ref 3.87–5.11)
WBC: 21.1 10*3/uL — ABNORMAL HIGH (ref 4.0–10.5)

## 2012-07-31 NOTE — Progress Notes (Signed)
Post Partum Day 1 Subjective: no complaints  Objective: Blood pressure 100/64, pulse 101, temperature 98.1 F (36.7 C), temperature source Oral, resp. rate 18, height 5\' 4"  (1.626 m), weight 72.349 kg (159 lb 8 oz), last menstrual period 10/29/2011, breastfeeding.  Physical Exam:  General: alert Lochia: appropriate Uterine Fundus: firm   Recent Labs  07/30/12 1010 07/31/12 0635  HGB 12.0 9.8*  HCT 34.3* 28.7*    Assessment/Plan: Plan for discharge tomorrow   LOS: 1 day   Mischelle Reeg D 07/31/2012, 9:19 AM

## 2012-08-01 MED ORDER — OXYCODONE-ACETAMINOPHEN 5-325 MG PO TABS: 1.0000 | ORAL_TABLET | ORAL | Status: DC | PRN

## 2012-08-01 NOTE — Discharge Summary (Signed)
Obstetric Discharge Summary Reason for Admission: onset of labor Prenatal Procedures: ultrasound Intrapartum Procedures: vacuum Postpartum Procedures: none Complications-Operative and Postpartum: 2nd degree perineal laceration (left) and right labia minora. Hemoglobin  Date Value Range Status  07/31/2012 9.8* 12.0 - 15.0 g/dL Final     DELTA CHECK NOTED     REPEATED TO VERIFY     HCT  Date Value Range Status  07/31/2012 28.7* 36.0 - 46.0 % Final    Physical Exam:  General: alert Lochia: appropriate Uterine Fundus: firm  Discharge Diagnoses: Term Pregnancy-delivered  Discharge Information: Date: 08/01/2012 Activity: pelvic rest Diet: routine Medications: PNV, Ibuprofen and Percocet Condition: improved Instructions: refer to practice specific booklet Discharge to: home Follow-up Information   Follow up with [name], MD. Schedule an appointment as soon as possible for a visit in 4 weeks.   Contact information:   [address] [place] [address] [phone]       Newborn Data: Live born female  Birth Weight: 8 lb 8.9 oz (3881 g) APGAR: 9, 9  Home with mother.  Sheila Salinas 08/01/2012, 9:44 AM

## 2013-11-17 LAB — OB RESULTS CONSOLE GC/CHLAMYDIA
Chlamydia: NEGATIVE
Gonorrhea: NEGATIVE

## 2013-11-17 LAB — OB RESULTS CONSOLE RPR: RPR: NONREACTIVE

## 2013-11-17 LAB — OB RESULTS CONSOLE HIV ANTIBODY (ROUTINE TESTING): HIV: NONREACTIVE

## 2013-11-17 LAB — OB RESULTS CONSOLE HEPATITIS B SURFACE ANTIGEN: Hepatitis B Surface Ag: NEGATIVE

## 2013-11-17 LAB — OB RESULTS CONSOLE ABO/RH: RH TYPE: POSITIVE

## 2013-11-17 LAB — OB RESULTS CONSOLE RUBELLA ANTIBODY, IGM: Rubella: IMMUNE

## 2013-11-17 LAB — OB RESULTS CONSOLE ANTIBODY SCREEN: Antibody Screen: NEGATIVE

## 2013-12-19 ENCOUNTER — Encounter (HOSPITAL_COMMUNITY): Payer: Self-pay | Admitting: Family

## 2014-02-17 NOTE — L&D Delivery Note (Signed)
Pt was admitted in labor. She progressed along a normal labor curve. She had AROM with mec. She pushed for 20 min and had a SVD of one live viable white female over a 1st degree tear in the LOA position. Nuchal cord x 1. Placenta-S/I. EBL-400 cc. Tear closed with 3-0 chromic.

## 2014-05-17 LAB — OB RESULTS CONSOLE GBS: STREP GROUP B AG: NEGATIVE

## 2014-06-25 ENCOUNTER — Inpatient Hospital Stay (HOSPITAL_COMMUNITY)
Admission: AD | Admit: 2014-06-25 | Discharge: 2014-06-27 | DRG: 775 | Disposition: A | Discharge status: Home / Self Care | Payer: Medicaid Other | Source: Ambulatory Visit | Attending: Obstetrics and Gynecology | Admitting: Obstetrics and Gynecology

## 2014-06-25 ENCOUNTER — Encounter (HOSPITAL_COMMUNITY): Payer: Self-pay | Admitting: *Deleted

## 2014-06-25 DIAGNOSIS — Z3A41 41 weeks gestation of pregnancy: Secondary | ICD-10-CM | POA: Diagnosis present

## 2014-06-25 DIAGNOSIS — O480 Post-term pregnancy: Principal | ICD-10-CM | POA: Diagnosis present

## 2014-06-25 DIAGNOSIS — IMO0001 Reserved for inherently not codable concepts without codable children: Secondary | ICD-10-CM

## 2014-06-25 DIAGNOSIS — Z3480 Encounter for supervision of other normal pregnancy, unspecified trimester: Secondary | ICD-10-CM

## 2014-06-25 LAB — TYPE AND SCREEN
ABO/RH(D): A POS
Antibody Screen: NEGATIVE

## 2014-06-25 LAB — CBC
HCT: 31.1 % — ABNORMAL LOW (ref 36.0–46.0)
Hemoglobin: 10.7 g/dL — ABNORMAL LOW (ref 12.0–15.0)
MCH: 31.4 pg (ref 26.0–34.0)
MCHC: 34.4 g/dL (ref 30.0–36.0)
MCV: 91.2 fL (ref 78.0–100.0)
PLATELETS: 208 10*3/uL (ref 150–400)
RBC: 3.41 MIL/uL — AB (ref 3.87–5.11)
RDW: 13.5 % (ref 11.5–15.5)
WBC: 11.6 10*3/uL — ABNORMAL HIGH (ref 4.0–10.5)

## 2014-06-25 LAB — RPR: RPR Ser Ql: NONREACTIVE

## 2014-06-25 MED ORDER — ONDANSETRON HCL 4 MG PO TABS: 4.0000 mg | ORAL_TABLET | ORAL | Status: DC | PRN

## 2014-06-25 MED ORDER — ZOLPIDEM TARTRATE 5 MG PO TABS: 5.0000 mg | ORAL_TABLET | Freq: Every evening | ORAL | Status: DC | PRN

## 2014-06-25 MED ORDER — ONDANSETRON HCL 4 MG/2ML IJ SOLN: 4.0000 mg | INTRAMUSCULAR | Status: DC | PRN

## 2014-06-25 MED ORDER — OXYTOCIN 40 UNITS IN LACTATED RINGERS INFUSION - SIMPLE MED
62.5000 mL/h | INTRAVENOUS | Status: DC
  Administered 2014-06-25: 62.5 mL/h via INTRAVENOUS
  Filled 2014-06-25: qty 1000

## 2014-06-25 MED ORDER — LACTATED RINGERS IV SOLN
INTRAVENOUS | Status: DC
  Administered 2014-06-25: 04:00:00 via INTRAVENOUS

## 2014-06-25 MED ORDER — OXYCODONE-ACETAMINOPHEN 5-325 MG PO TABS: 2.0000 | ORAL_TABLET | ORAL | Status: DC | PRN

## 2014-06-25 MED ORDER — EPHEDRINE 5 MG/ML INJ
10.0000 mg | INTRAVENOUS | Status: DC | PRN
  Filled 2014-06-25: qty 2

## 2014-06-25 MED ORDER — OXYTOCIN BOLUS FROM INFUSION
500.0000 mL | INTRAVENOUS | Status: DC
  Administered 2014-06-25: 500 mL via INTRAVENOUS

## 2014-06-25 MED ORDER — FENTANYL 2.5 MCG/ML BUPIVACAINE 1/10 % EPIDURAL INFUSION (WH - ANES): 14.0000 mL/h | INTRAMUSCULAR | Status: DC | PRN

## 2014-06-25 MED ORDER — BUTORPHANOL TARTRATE 1 MG/ML IJ SOLN
1.0000 mg | INTRAMUSCULAR | Status: DC | PRN
  Administered 2014-06-25 (×6): 1 mg via INTRAVENOUS
  Filled 2014-06-25 (×6): qty 1

## 2014-06-25 MED ORDER — ACETAMINOPHEN 325 MG PO TABS: 650.0000 mg | ORAL_TABLET | ORAL | Status: DC | PRN

## 2014-06-25 MED ORDER — LIDOCAINE HCL (PF) 1 % IJ SOLN
30.0000 mL | INTRAMUSCULAR | Status: AC | PRN
  Administered 2014-06-25 (×2): 30 mL via SUBCUTANEOUS
  Filled 2014-06-25 (×2): qty 30

## 2014-06-25 MED ORDER — DIPHENHYDRAMINE HCL 50 MG/ML IJ SOLN: 12.5000 mg | INTRAMUSCULAR | Status: DC | PRN

## 2014-06-25 MED ORDER — BENZOCAINE-MENTHOL 20-0.5 % EX AERO
1.0000 "application " | INHALATION_SPRAY | CUTANEOUS | Status: DC | PRN
  Administered 2014-06-27: 1 via TOPICAL
  Filled 2014-06-25 (×2): qty 56

## 2014-06-25 MED ORDER — SIMETHICONE 80 MG PO CHEW: 80.0000 mg | CHEWABLE_TABLET | ORAL | Status: DC | PRN

## 2014-06-25 MED ORDER — PRENATAL MULTIVITAMIN CH
1.0000 | ORAL_TABLET | Freq: Every day | ORAL | Status: AC
  Administered 2014-06-25 – 2014-06-27 (×3): 1 via ORAL
  Filled 2014-06-25 (×3): qty 1

## 2014-06-25 MED ORDER — SENNOSIDES-DOCUSATE SODIUM 8.6-50 MG PO TABS
2.0000 | ORAL_TABLET | ORAL | Status: DC
  Administered 2014-06-25 – 2014-06-27 (×2): 2 via ORAL
  Filled 2014-06-25 (×2): qty 2

## 2014-06-25 MED ORDER — WITCH HAZEL-GLYCERIN EX PADS: 1.0000 "application " | MEDICATED_PAD | CUTANEOUS | Status: DC | PRN

## 2014-06-25 MED ORDER — CITRIC ACID-SODIUM CITRATE 334-500 MG/5ML PO SOLN: 30.0000 mL | ORAL | Status: DC | PRN

## 2014-06-25 MED ORDER — ONDANSETRON HCL 4 MG/2ML IJ SOLN: 4.0000 mg | Freq: Four times a day (QID) | INTRAMUSCULAR | Status: DC | PRN

## 2014-06-25 MED ORDER — IBUPROFEN 600 MG PO TABS
600.0000 mg | ORAL_TABLET | Freq: Four times a day (QID) | ORAL | Status: DC
  Administered 2014-06-25 – 2014-06-27 (×9): 600 mg via ORAL
  Filled 2014-06-25 (×9): qty 1

## 2014-06-25 MED ORDER — LACTATED RINGERS IV SOLN: 500.0000 mL | INTRAVENOUS | Status: DC | PRN

## 2014-06-25 MED ORDER — PHENYLEPHRINE 40 MCG/ML (10ML) SYRINGE FOR IV PUSH (FOR BLOOD PRESSURE SUPPORT)
80.0000 ug | PREFILLED_SYRINGE | INTRAVENOUS | Status: DC | PRN
  Filled 2014-06-25: qty 2

## 2014-06-25 MED ORDER — OXYCODONE-ACETAMINOPHEN 5-325 MG PO TABS
1.0000 | ORAL_TABLET | ORAL | Status: DC | PRN
  Administered 2014-06-25 – 2014-06-27 (×11): 1 via ORAL
  Filled 2014-06-25 (×11): qty 1

## 2014-06-25 MED ORDER — DIBUCAINE 1 % RE OINT: 1.0000 "application " | TOPICAL_OINTMENT | RECTAL | Status: DC | PRN

## 2014-06-25 MED ORDER — TETANUS-DIPHTH-ACELL PERTUSSIS 5-2.5-18.5 LF-MCG/0.5 IM SUSP: 0.5000 mL | Freq: Once | INTRAMUSCULAR | Status: DC

## 2014-06-25 MED ORDER — MEASLES, MUMPS & RUBELLA VAC ~~LOC~~ INJ
0.5000 mL | INJECTION | Freq: Once | SUBCUTANEOUS | Status: DC
  Filled 2014-06-25: qty 0.5

## 2014-06-25 MED ORDER — FLEET ENEMA 7-19 GM/118ML RE ENEM: 1.0000 | ENEMA | RECTAL | Status: DC | PRN

## 2014-06-25 NOTE — MAU Note (Signed)
Pt reports having ctx on and off all day got painful around 1130 tonight. Denies SROM or bleeding at this time. Good fetal movement

## 2014-06-25 NOTE — MAU Note (Signed)
[facility] Ok for pt to come to [phone]

## 2014-06-25 NOTE — H&P (Signed)
Pt is a 29 y/o white female , G3 P1011 at 41 weeks who presents to L&D in active labor. PNC was uncomplicated. There is a FMHX of a congenital heart defect and club foot. Fetus had a nl fetal echo.  PMHX: See PNR PE: VSSAF        HEENT-wnl        ABD-gravid        FHTS- reactive IMP/ IUP at term in labor Plan/ Admit

## 2014-06-26 NOTE — Lactation Note (Signed)
This note was copied from the chart of Sheila Salinas. Lactation Consultation Note: Staff nurse reports that mother has a small blister on the left nipple. Mother was given comfort gels. Mother states that infant had a poor latch the first few feedings. Mother is napping at this time. Advised mother to page for assistance with the next feeding.   Patient Name: Sheila Sheila Salinas [id number]'YToday's Date: 06/26/2014 Reason for consult: Follow-up assessment   Maternal Data    Feeding    LATCH Score/Interventions                      Lactation Tools Discussed/Used     Consult Status Consult Status: Follow-up Date: 06/26/14 Follow-up type: In-patient    Stevan BornKendrick, Marquies Wanat [facility] 06/26/2014, 2:36 PM

## 2014-06-26 NOTE — Lactation Note (Signed)
This note was copied from the chart of Sheila Farris HasDelena Lurie. Lactation Consultation Note Experienced BF mom BF her 31 yr old for 11 months. C/o sore nipples after birth d/t mom has large nipples and newborns has a hard time keeping a deep latch. 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 Discussed mom if having latching pain and it is deep, or baby isn't empyting breast well or decrease in weight or output, might need to use a NS. Encouraged her to let RN and LC know if this occurs.  Mom has large everted nipples easily expressed colostrum.  Mom has a DEBP set up in room per her request . RN set up, mom stated she knows how to clean. Mom encouraged to feed baby 8-12 times/24 hours and with feeding cues. Mom encouraged to do skin-to-skin. Mom encouraged to waken baby for feeds. Referred to Baby and Me Book in Breastfeeding section Pg. 22-23 for position options and Proper latch demonstration. Encouraged comfort during BF so colostrum flows better and mom will enjoy the feeding longer. Taking deep breaths and breast massage during BF. WH/LC brochure given w/resources, support groups and LC services. Educated about newborn behavior. Patient Name: Sheila Salinas DGLOV'FToday's Date: 06/26/2014 Reason for consult: Initial assessment   Maternal Data Has patient been taught Hand Expression?: Yes Does the patient have breastfeeding experience prior to this delivery?: Yes  Feeding Feeding Type: Breast Fed Length of feed: 20 min  LATCH Score/Interventions Latch: Grasps breast easily, tongue down, lips flanged, rhythmical sucking.  Audible Swallowing: Spontaneous and intermittent  Type of Nipple: Everted at rest and after stimulation  Comfort (Breast/Nipple): Soft / non-tender     Hold (Positioning): No assistance needed to correctly position infant at breast. Intervention(s): Skin to skin;Position options;Support Pillows;Breastfeeding basics reviewed  LATCH Score:  10  Lactation Tools Discussed/Used Tools: Pump Breast pump type: Double-Electric Breast Pump Pump Review: Setup, frequency, and cleaning;Milk Storage Initiated by:: RN Date initiated:: 06/25/14   Consult Status Consult Status: Follow-up Date: 06/27/14 Follow-up type: In-patient    Charyl DancerCARVER, [name] 06/26/2014, 5:43 AM

## 2014-06-26 NOTE — Progress Notes (Signed)
Patient is eating, ambulating, voiding.  Pain control is good.  Appropriate lochia, no complaints.  Filed Vitals:   06/25/14 1119 06/25/14 1220 06/25/14 1720 06/25/14 2359  BP: 135/82 121/77 141/85 128/85  Pulse: 90 89 93 92  Temp: 97.6 F (36.4 C) 98.9 F (37.2 C) 99.3 F (37.4 C) 98 F (36.7 C)  TempSrc: Oral Oral Oral   Resp: 18 18 18 18   Height:      Weight:      SpO2: 99% 97% 97%     Fundus firm Perineum without swelling. No CT  Lab Results  Component Value Date   WBC 11.6* 06/25/2014   HGB 10.7* 06/25/2014   HCT 31.1* 06/25/2014   MCV 91.2 06/25/2014   PLT 208 06/25/2014    --/--/A POS (05/08 0120)  A/P Post partum day 1. Doing well.  Routine care.  Expect d/c 5/10.    Philip AspenALLAHAN, Eartha Vonbehren

## 2014-06-27 ENCOUNTER — Ambulatory Visit: Payer: Self-pay

## 2014-06-27 MED ORDER — PRENATAL MULTIVITAMIN CH: 1.0000 | ORAL_TABLET | Freq: Every day | ORAL | Status: DC

## 2014-06-27 MED ORDER — IBUPROFEN 600 MG PO TABS: 600.0000 mg | ORAL_TABLET | Freq: Four times a day (QID) | ORAL | Status: DC | PRN

## 2014-06-27 MED ORDER — OXYCODONE-ACETAMINOPHEN 5-325 MG PO TABS: 1.0000 | ORAL_TABLET | ORAL | Status: DC | PRN

## 2014-06-27 NOTE — Lactation Note (Signed)
This note was copied from the chart of Sheila Salinas Ewald. Lactation Consultation Note: Mother paged to assist with latch and have infants frenula assessed.  Mother taught asymmetrical latch technique. Infant sustained latch for 15 mins. Observed good milk transfer. Mother taught breast compression and latched infant on the alternate breast. Infant sustained good depth without causing mother any pain. Mother taught to roll infants top lip upward and adjust infants lower jaw for wider gape. Infant has a high palate and an anterior tight frenula. Discussed with mother the need to get good depth and protect her milk supply with post pump 1 time daily. Parents were given specialist information to have frenula assessed .   Patient Name: Sheila Salinas Tolle AOZHY'QToday's Date: 06/27/2014 Reason for consult: Follow-up assessment   Maternal Data    Feeding Feeding Type: Breast Fed Length of feed: 15 min  LATCH Score/Interventions Latch: Grasps breast easily, tongue down, lips flanged, rhythmical sucking.  Audible Swallowing: Spontaneous and intermittent  Type of Nipple: Everted at rest and after stimulation  Comfort (Breast/Nipple): Filling, red/small blisters or bruises, mild/mod discomfort     Hold (Positioning): Assistance needed to correctly position infant at breast and maintain latch.  LATCH Score: 8  Lactation Tools Discussed/Used     Consult Status Consult Status: Complete    Michel BickersKendrick, Ronneisha Jett [facility] 06/27/2014, 4:38 PM

## 2014-06-27 NOTE — Lactation Note (Signed)
This note was copied from the chart of Sheila Farris HasDelena Randol. Lactation Consultation Note: Mother is continuing to have sore nipple's. Nipple's are pink bilaterally. Mother states she feels pain during infant's feeding. Mother request to have feeding observed with the next feeding. Mother states she has concerns about infant's tight frenula. Infant just finished a feeding and is asleep in grandmother's arms. [facility] for next feeding assist.   Patient Name: Sheila Salinas WUJWJ'XToday's Date: 06/27/2014 Reason for consult: Follow-up assessment   Maternal Data    Feeding Feeding Type: Breast Fed Length of feed: 20 min  LATCH Score/Interventions Latch: Grasps breast easily, tongue down, lips flanged, rhythmical sucking.  Audible Swallowing: Spontaneous and intermittent  Type of Nipple: Everted at rest and after stimulation  Comfort (Breast/Nipple): Filling, red/small blisters or bruises, mild/mod discomfort  Problem noted: Mild/Moderate discomfort Interventions  (Cracked/bleeding/bruising/blister): Expressed breast milk to nipple Interventions (Mild/moderate discomfort): Hand expression  Hold (Positioning): Assistance needed to correctly position infant at breast and maintain latch. Intervention(s): Support Pillows;Breastfeeding basics reviewed  LATCH Score: 8  Lactation Tools Discussed/Used     Consult Status Consult Status: Follow-up Date: 06/27/14 Follow-up type: In-patient    Stevan BornKendrick, Derico Mitton [facility] 06/27/2014, 10:36 AM

## 2014-06-27 NOTE — Discharge Summary (Signed)
Obstetric Discharge Summary Reason for Admission: onset of labor Prenatal Procedures: NST and ultrasound Intrapartum Procedures: spontaneous vaginal delivery Postpartum Procedures: none Complications-Operative and Postpartum: none HEMOGLOBIN  Date Value Ref Range Status  06/25/2014 10.7* 12.0 - 15.0 g/dL Final   HCT  Date Value Ref Range Status  06/25/2014 31.1* 36.0 - 46.0 % Final    Physical Exam:  General: alert, cooperative and no distress Lochia: appropriate Uterine Fundus: firm perineum: healing well, no significant drainage, no dehiscence DVT Evaluation: No evidence of DVT seen on physical exam. Negative Homan's sign. No cords or calf tenderness.  Discharge Diagnoses: Term Pregnancy-delivered  Discharge Information: Date: 06/27/2014 Activity: pelvic rest Diet: routine Medications: PNV, Ibuprofen and Percocet Condition: stable Instructions: refer to practice specific booklet Discharge to: home   Newborn Data: Live born female  Birth Weight: 9 lb 2.7 oz (4160 g) APGAR: 8, 9  Home with mother.  Essie HartINN, Hagen Tidd STACIA 06/27/2014, 12:24 PM

## 2014-06-27 NOTE — Progress Notes (Signed)
Post Partum Day 2 Subjective: no complaints, up ad lib, voiding, tolerating PO and + flatus  Objective: Blood pressure 121/77, pulse 85, temperature 98.5 F (36.9 C), temperature source Oral, resp. rate 18, height 5\' 4"  (1.626 m), weight 76.204 kg (168 lb), SpO2 97 %, unknown if currently breastfeeding.  Physical Exam:  General: alert, cooperative and no distress Lochia: appropriate Uterine Fundus: firm perineum: healing well, no significant drainage, no dehiscence DVT Evaluation: No evidence of DVT seen on physical exam. Negative Homan's sign. No cords or calf tenderness.   Recent Labs  06/25/14 0120  HGB 10.7*  HCT 31.1*    Assessment/Plan: Discharge home and Breastfeeding   LOS: 2 days   Sheila Salinas STACIA 06/27/2014, 12:21 PM

## 2014-10-19 IMAGING — US US OB DETAIL+14 WK
1 series · 12 of 28 positions shown · non-contrast
Comparison: none

[Series 1: us ob detail+14 wk · 0.21mm/px · 12 of 91 slices shown]
[im 4/91]
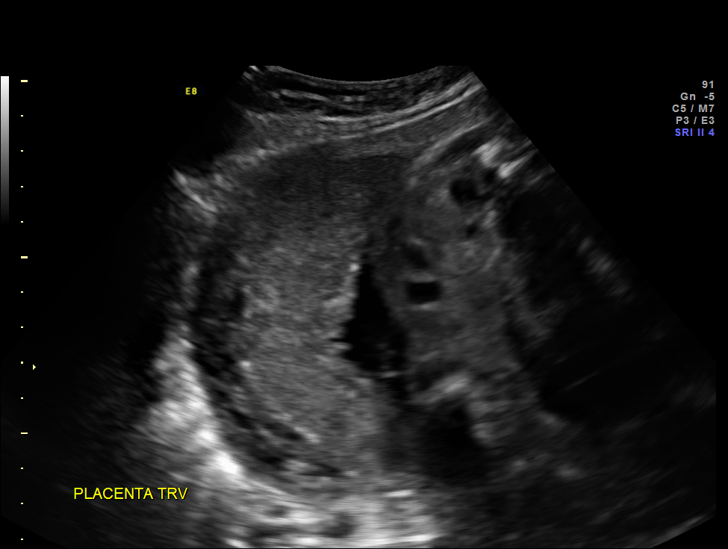
[im 11/91]
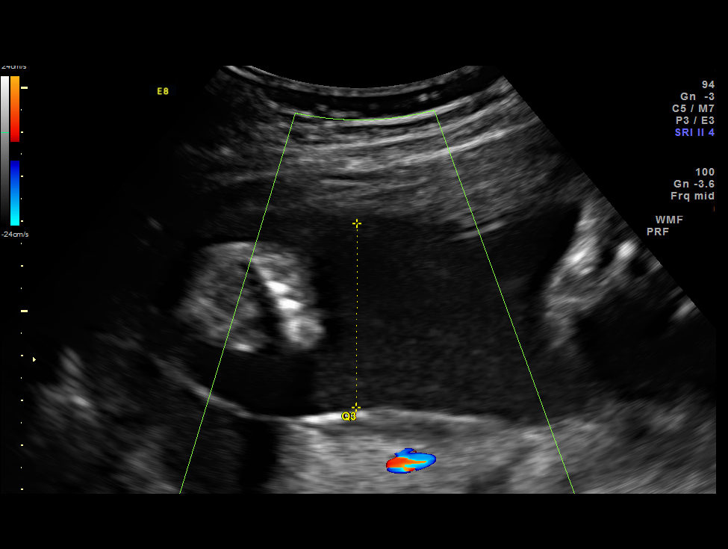
[im 17/91]
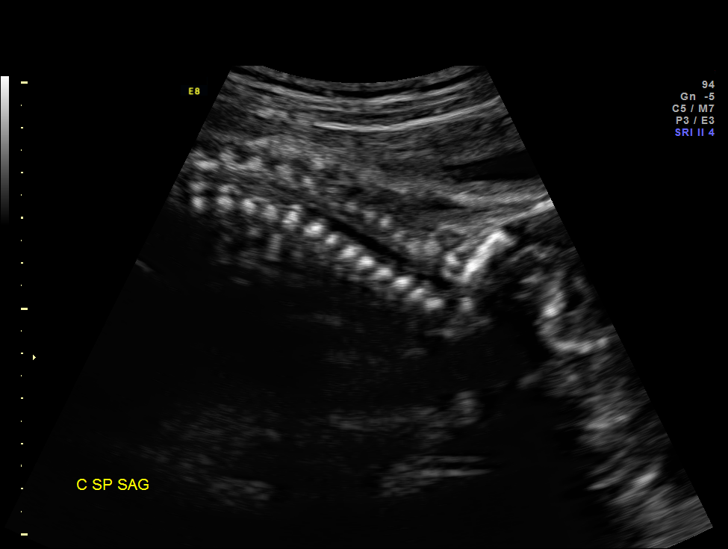
[im 27/91]
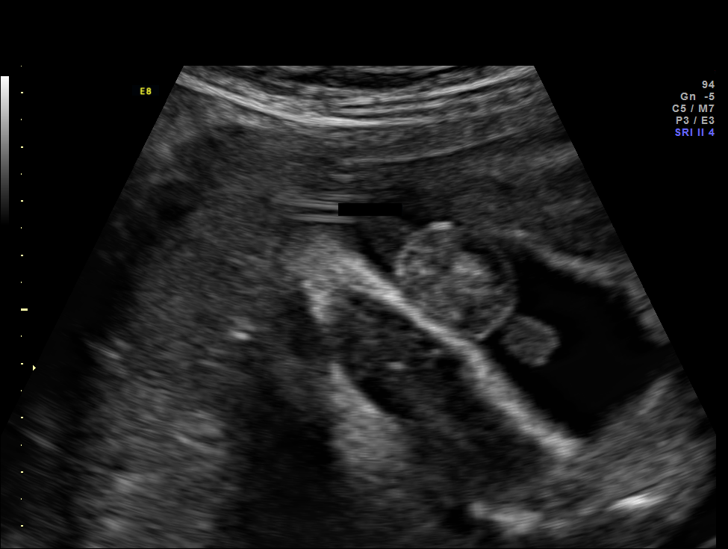
[im 34/91]
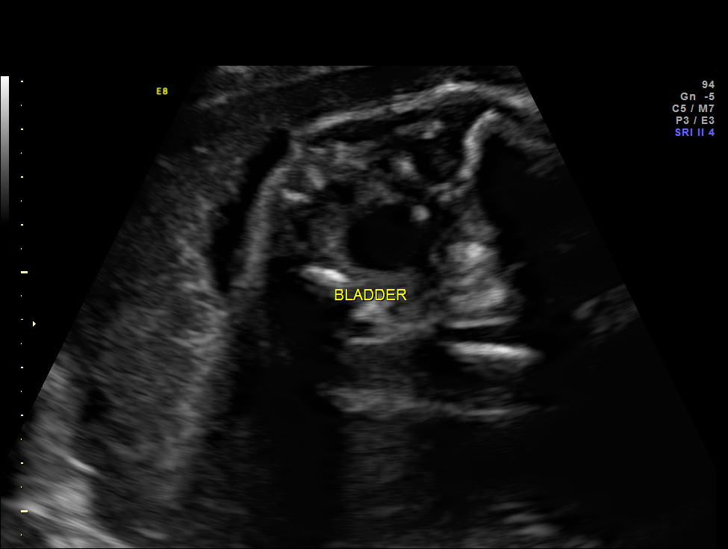
[im 41/91]
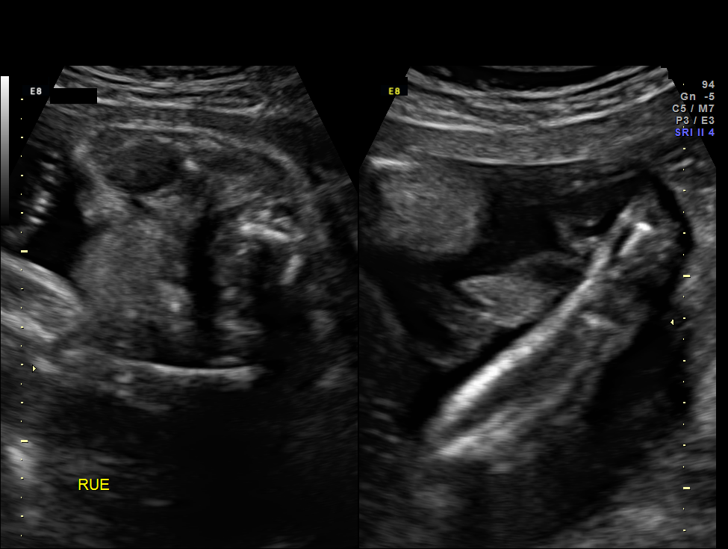
[im 51/91]
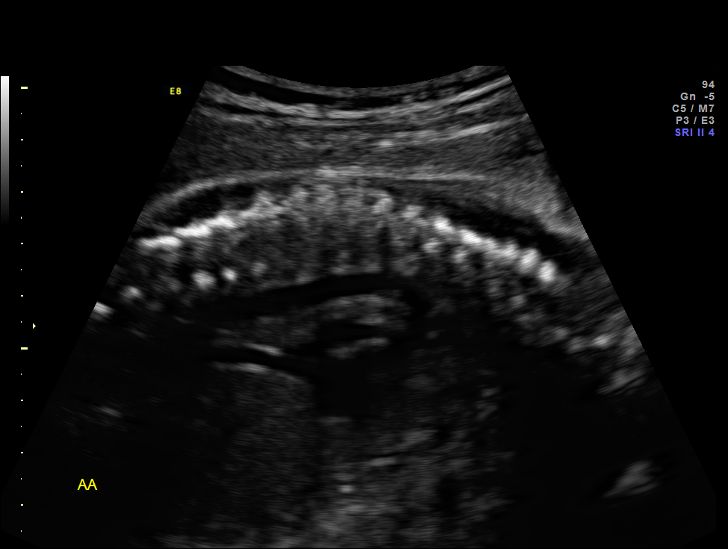
[im 57/91]
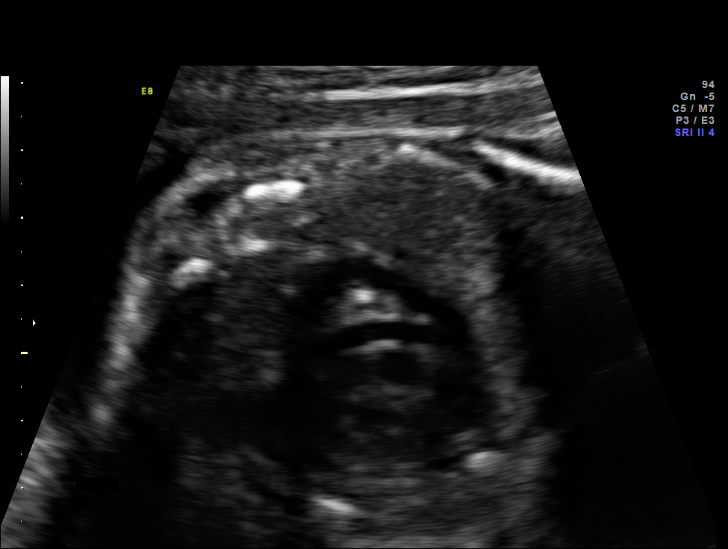
[im 64/91]
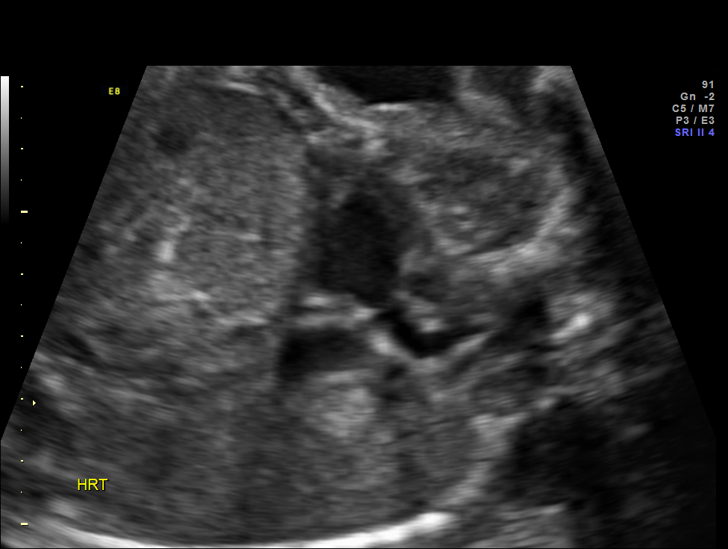
[im 74/91]
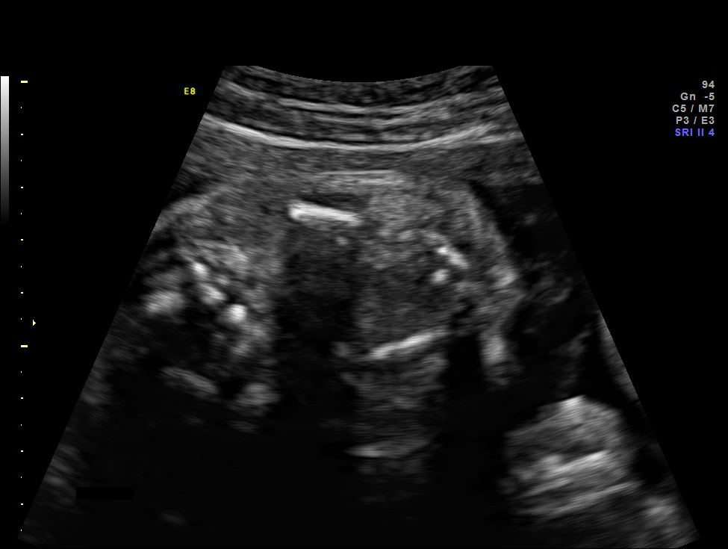
[im 81/91]
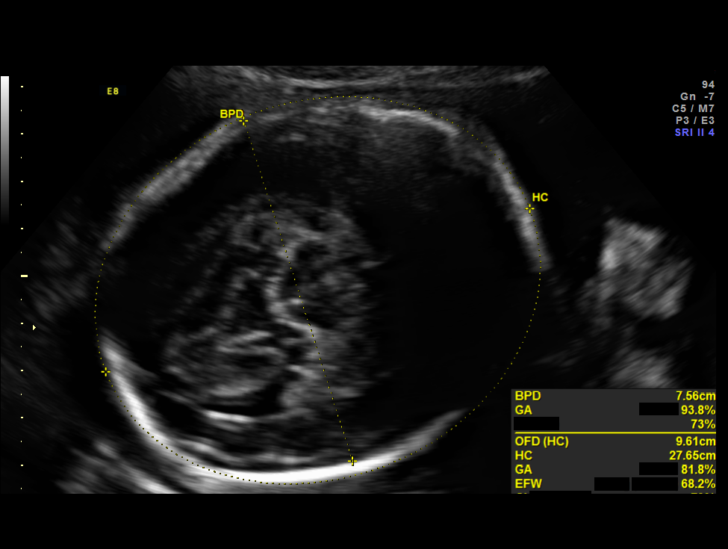
[im 87/91]
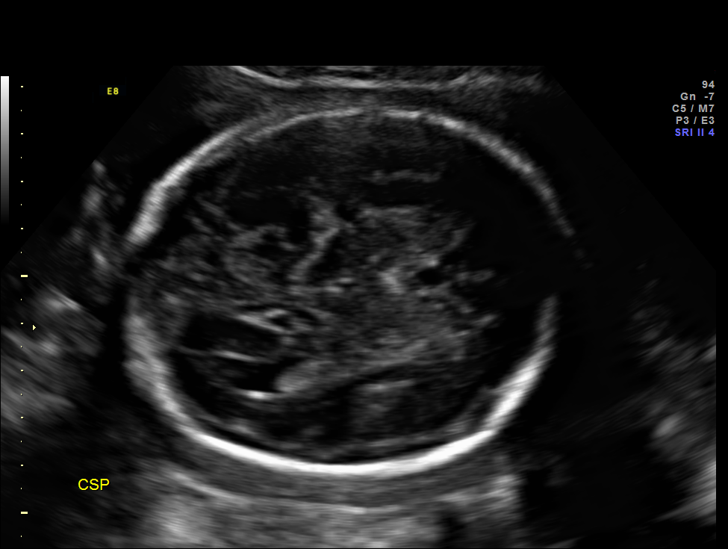

[12 of 28 positions shown; findings below may reference images not displayed]

OBSTETRICS REPORT
                      (Signed Final 05/13/2012 [DATE])

Service(s) Provided

 US OB DETAIL + 14 WK                                  76811.0
Indications

 Detailed fetal anatomic survey
 History of genetic / anatomic abnormality - CHD
 (pt's deceased sister)
Fetal Evaluation

 Num Of Fetuses:    1
 Fetal Heart Rate:  161                          bpm
 Cardiac Activity:  Observed
 Presentation:      Cephalic
 Placenta:          Posterior right, above
                    cervical os
 P. Cord            Visualized, central
 Insertion:

 Amniotic Fluid
 AFI FV:      Subjectively within normal limits
 AFI Sum:     13.56   cm       41  %Tile     Larg Pckt:    4.12  cm
 RUQ:   3.27    cm   RLQ:    3.17   cm    LUQ:   3       cm   LLQ:    4.12   cm
Biometry

 BPD:     75.6  mm     G. Age:  30w 2d                CI:         78.4   70 - 86
 OFD:     96.4  mm                                    FL/HC:      20.0   18.8 -

 HC:       276  mm     G. Age:  30w 1d       80  %    HC/AC:      1.11   1.05 -

 AC:     248.3  mm     G. Age:  29w 0d       70  %    FL/BPD:     73.0   71 - 87
 FL:      55.2  mm     G. Age:  29w 1d       63  %    FL/AC:      22.2   20 - 24
 HUM:       50  mm     G. Age:  29w 2d       69  %
 CER:     33.4  mm     G. Age:  29w 1d       64  %

 Est. FW:    2712  gm           3 lb     74  %
Gestational Age

 LMP:           28w 1d        Date:  10/29/11                 EDD:   08/04/12
 U/S Today:     29w 4d                                        EDD:   07/25/12
 Best:          28w 1d     Det. By:  LMP  (10/29/11)          EDD:   08/04/12
Anatomy

 Cranium:          Appears normal         Aortic Arch:      Appears normal
 Fetal Cavum:      Appears normal         Ductal Arch:      Appears normal
 Ventricles:       Appears normal         Diaphragm:        Appears normal
 Choroid Plexus:   Appears normal         Stomach:          Appears normal
 Cerebellum:       Appears normal         Abdomen:          Appears normal
 Posterior Fossa:  Appears normal         Abdominal Wall:   Appears nml (cord
                                                            insert, abd wall)
 Nuchal Fold:      Not applicable (>20    Cord Vessels:     Appears normal (3
                   wks GA)                                  vessel cord)
 Face:             Appears normal         Kidneys:          Appear normal
                   (orbits and profile)
 Lips:             Appears normal         Bladder:          Appears normal
 Heart:            Appears normal         Spine:            Appears normal
                   (4CH, axis, and
                   situs)
 RVOT:             Appears normal         Lower             Appears normal
                                          Extremities:
 LVOT:             Appears normal         Upper             Appears normal
                                          Extremities:

 Other:  Fetus appears to be a male. Heels and 5th digit visualized. Nasal
         bone visualized.
Targeted Anatomy

 Fetal Central Nervous System
 Cisterna Magna:
Cervix Uterus Adnexa

 Cervical Length:    3.1      cm

 Cervix:       Normal appearance by transabdominal scan.

 Left Ovary:    Within normal limits.
 Right Ovary:   Within normal limits.
 Adnexa:     No abnormality visualized.
Comments

 Ms. Robbins was seen today due to a family history of
 congenital heart defects.  She reports that her sisted died
 during the first year of life due to hypoplastic left heart and
 pulmonary atresia.  The fetal anatomic survey today was
 within normal limits.  The 4CH, outflow tracts, aortic and
 ductal arches appeared normal.  The patient was offerred a
 formal fetal echo with Peds cardiology as well as Genetics
 counseling and declined.
Impression

 Single IUP at 281/7 weeks
 Normal detailed fetal anatomy
 The fetal heart was well-visualized and appears normal
 Fetal growth is appropriate (74th %tile)
 Normal amniotic fluid volume
Recommendations

 Follow-up ultrasounds as clinically indicated.
 questions or concerns.

## 2017-06-25 DIAGNOSIS — Z124 Encounter for screening for malignant neoplasm of cervix: Secondary | ICD-10-CM | POA: Diagnosis not present

## 2017-06-25 DIAGNOSIS — Z13220 Encounter for screening for lipoid disorders: Secondary | ICD-10-CM | POA: Diagnosis not present

## 2017-06-25 DIAGNOSIS — N920 Excessive and frequent menstruation with regular cycle: Secondary | ICD-10-CM | POA: Diagnosis not present

## 2017-06-25 DIAGNOSIS — Z0000 Encounter for general adult medical examination without abnormal findings: Secondary | ICD-10-CM | POA: Diagnosis not present

## 2017-07-06 ENCOUNTER — Other Ambulatory Visit: Payer: Self-pay | Admitting: Physician Assistant

## 2017-07-06 DIAGNOSIS — N6320 Unspecified lump in the left breast, unspecified quadrant: Secondary | ICD-10-CM | POA: Diagnosis not present

## 2017-07-07 ENCOUNTER — Ambulatory Visit
Admission: RE | Admit: 2017-07-07 | Discharge: 2017-07-07 | Disposition: A | Discharge status: Home / Self Care | Payer: BLUE CROSS/BLUE SHIELD | Source: Ambulatory Visit | Attending: Physician Assistant | Admitting: Physician Assistant

## 2017-07-07 DIAGNOSIS — N6320 Unspecified lump in the left breast, unspecified quadrant: Secondary | ICD-10-CM

## 2017-07-07 DIAGNOSIS — N920 Excessive and frequent menstruation with regular cycle: Secondary | ICD-10-CM | POA: Diagnosis not present

## 2017-07-07 DIAGNOSIS — R922 Inconclusive mammogram: Secondary | ICD-10-CM | POA: Diagnosis not present

## 2017-07-07 DIAGNOSIS — N6489 Other specified disorders of breast: Secondary | ICD-10-CM | POA: Diagnosis not present

## 2017-11-03 DIAGNOSIS — H9201 Otalgia, right ear: Secondary | ICD-10-CM | POA: Diagnosis not present

## 2017-11-03 DIAGNOSIS — H6591 Unspecified nonsuppurative otitis media, right ear: Secondary | ICD-10-CM | POA: Diagnosis not present

## 2017-11-03 DIAGNOSIS — H60501 Unspecified acute noninfective otitis externa, right ear: Secondary | ICD-10-CM | POA: Diagnosis not present

## 2019-03-22 DIAGNOSIS — B353 Tinea pedis: Secondary | ICD-10-CM | POA: Diagnosis not present

## 2019-03-22 DIAGNOSIS — L308 Other specified dermatitis: Secondary | ICD-10-CM | POA: Diagnosis not present

## 2019-05-11 DIAGNOSIS — B353 Tinea pedis: Secondary | ICD-10-CM | POA: Diagnosis not present

## 2019-05-11 DIAGNOSIS — L308 Other specified dermatitis: Secondary | ICD-10-CM | POA: Diagnosis not present

## 2019-10-26 DIAGNOSIS — M542 Cervicalgia: Secondary | ICD-10-CM | POA: Diagnosis not present

## 2019-10-28 DIAGNOSIS — M542 Cervicalgia: Secondary | ICD-10-CM | POA: Diagnosis not present

## 2019-11-04 DIAGNOSIS — M542 Cervicalgia: Secondary | ICD-10-CM | POA: Diagnosis not present

## 2019-11-15 DIAGNOSIS — N6320 Unspecified lump in the left breast, unspecified quadrant: Secondary | ICD-10-CM | POA: Diagnosis not present

## 2019-11-22 ENCOUNTER — Other Ambulatory Visit: Payer: Self-pay | Admitting: Nurse Practitioner

## 2019-11-22 DIAGNOSIS — N6320 Unspecified lump in the left breast, unspecified quadrant: Secondary | ICD-10-CM

## 2020-02-21 DIAGNOSIS — U071 COVID-19: Secondary | ICD-10-CM | POA: Diagnosis not present

## 2020-04-02 ENCOUNTER — Ambulatory Visit (INDEPENDENT_AMBULATORY_CARE_PROVIDER_SITE_OTHER): Payer: Self-pay | Admitting: Plastic Surgery

## 2020-04-02 ENCOUNTER — Other Ambulatory Visit: Payer: Self-pay

## 2020-04-02 ENCOUNTER — Encounter: Payer: Self-pay | Admitting: Plastic Surgery

## 2020-04-02 DIAGNOSIS — Z719 Counseling, unspecified: Secondary | ICD-10-CM | POA: Insufficient documentation

## 2020-04-02 DIAGNOSIS — N6489 Other specified disorders of breast: Secondary | ICD-10-CM

## 2020-04-02 NOTE — Progress Notes (Addendum)
Patient ID: Sheila Salinas, female    DOB: 1985/09/04, 35 y.o.   MRN: [id number]   Chief Complaint  Patient presents with   Advice Only    The patient is a 35 year old female here for evaluation of her breast.  She has had children and is very active.  She did breast-feed.  She is 5 feet 4 inches tall weighs 142 pounds.  She is very healthy she is not a smoker and does not have diabetes.  She does have a family history of breast cancer in a paternal aunt and maternal grandmother.  She had a mammogram 2 years ago.  She had fibroglandular tissue on the left breast, no other areas of concern.  Her preoperative bra size is a 34 DD.  Her sternal notch to nipple distance on the right is 21 cm and 23 cm on the left.  She pretty much wants to be the same size just perkier and have the option to not have to wear a bra at all time.  She describes some neck pain with some of the sports bras and bathing suits that tire around her neck.  Implants could make that worse.   Review of Systems  Constitutional: Negative.  Negative for activity change and appetite change.  Eyes: Negative.   Respiratory: Negative.  Negative for chest tightness and shortness of breath.   Cardiovascular: Negative for leg swelling.  Gastrointestinal: Negative for abdominal distention and abdominal pain.  Endocrine: Negative.   Genitourinary: Negative.   Musculoskeletal: Positive for neck pain.  Neurological: Negative.   Hematological: Negative.   Psychiatric/Behavioral: Negative.     Past Medical History:  Diagnosis Date   Ectopic pregnancy, tubal 05/06/2011   Tx'd with MTX in [facility]    History reviewed. No pertinent surgical history.    Current Outpatient Medications:    calcium carbonate (TUMS - DOSED IN MG ELEMENTAL CALCIUM) 500 MG chewable tablet, Chew 1 tablet by mouth 3 (three) times daily as needed for heartburn., Disp: , Rfl:    ibuprofen (ADVIL,MOTRIN) 600 MG tablet, Take 1 tablet (600 mg total) by mouth  every 6 (six) hours as needed., Disp: 60 tablet, Rfl: 3   IRON PO, Take 1 tablet by mouth daily., Disp: , Rfl:    oxyCODONE-acetaminophen (PERCOCET/ROXICET) 5-325 MG per tablet, Take 1-2 tablets by mouth every 4 (four) hours as needed for severe pain (for pain scale 4-7)., Disp: 30 tablet, Rfl: 0   Prenatal Vit-Fe Fumarate-FA (PRENATAL MULTIVITAMIN) TABS tablet, Take 1 tablet by mouth daily at 12 noon., Disp: 60 tablet, Rfl: 3   Objective:   Vitals:   04/02/20 0909  BP: 111/73  Pulse: 73  SpO2: 98%    Physical Exam Vitals and nursing note reviewed.  Constitutional:      Appearance: Normal appearance.  HENT:     Head: Normocephalic and atraumatic.  Cardiovascular:     Rate and Rhythm: Normal rate.     Pulses: Normal pulses.  Pulmonary:     Effort: Pulmonary effort is normal.  Abdominal:     General: Abdomen is flat.  Skin:    General: Skin is warm.     Capillary Refill: Capillary refill takes less than 2 seconds.  Neurological:     General: No focal deficit present.     Mental Status: She is alert and oriented to person, place, and time.  Psychiatric:        Mood and Affect: Mood normal.  Behavior: Behavior normal.     Assessment & Plan:  Encounter for counseling  Breast asymmetry  The patient is a good candidate for a mastopexy.  This would give her a little more percutaneous without loss of too much volume.  It may also help to even out her areola and nipples to be more symmetric than they are now.  Pictures were obtained of the patient and placed in the chart with the patient's or guardian's permission.  We will provide her with a quote.  Alena Bills Elida Harbin, DO

## 2020-04-04 ENCOUNTER — Other Ambulatory Visit: Payer: Self-pay | Admitting: Plastic Surgery

## 2020-04-04 DIAGNOSIS — S60454A Superficial foreign body of right ring finger, initial encounter: Secondary | ICD-10-CM | POA: Diagnosis not present

## 2020-04-04 DIAGNOSIS — N6489 Other specified disorders of breast: Secondary | ICD-10-CM

## 2020-04-04 NOTE — Addendum Note (Signed)
Addended by: Peggye Form on: 04/04/2020 02:13 PM   Modules accepted: Orders

## 2020-05-21 ENCOUNTER — Ambulatory Visit
Admission: RE | Admit: 2020-05-21 | Discharge: 2020-05-21 | Disposition: A | Discharge status: Home / Self Care | Payer: BC Managed Care – PPO | Source: Ambulatory Visit | Attending: Plastic Surgery | Admitting: Plastic Surgery

## 2020-05-21 ENCOUNTER — Other Ambulatory Visit: Payer: Self-pay

## 2020-05-21 ENCOUNTER — Ambulatory Visit: Admission: RE | Admit: 2020-05-21 | Payer: Self-pay | Source: Ambulatory Visit

## 2020-05-21 DIAGNOSIS — N6489 Other specified disorders of breast: Secondary | ICD-10-CM

## 2020-05-21 DIAGNOSIS — R922 Inconclusive mammogram: Secondary | ICD-10-CM | POA: Diagnosis not present

## 2020-06-13 DIAGNOSIS — L308 Other specified dermatitis: Secondary | ICD-10-CM | POA: Diagnosis not present

## 2020-06-18 DIAGNOSIS — L239 Allergic contact dermatitis, unspecified cause: Secondary | ICD-10-CM | POA: Diagnosis not present

## 2020-06-21 DIAGNOSIS — L234 Allergic contact dermatitis due to dyes: Secondary | ICD-10-CM | POA: Diagnosis not present

## 2020-07-12 DIAGNOSIS — M542 Cervicalgia: Secondary | ICD-10-CM | POA: Diagnosis not present

## 2021-01-22 ENCOUNTER — Other Ambulatory Visit: Payer: Self-pay

## 2021-01-22 ENCOUNTER — Ambulatory Visit: Payer: BC Managed Care – PPO | Admitting: Allergy and Immunology

## 2021-01-22 ENCOUNTER — Encounter: Payer: Self-pay | Admitting: Allergy and Immunology

## 2021-01-22 VITALS — BP 108/72 | HR 103 | Temp 98.2°F | Resp 16 | Ht 64.0 in | Wt 142.2 lb

## 2021-01-22 DIAGNOSIS — L2089 Other atopic dermatitis: Secondary | ICD-10-CM

## 2021-01-22 DIAGNOSIS — L989 Disorder of the skin and subcutaneous tissue, unspecified: Secondary | ICD-10-CM | POA: Diagnosis not present

## 2021-01-22 DIAGNOSIS — L253 Unspecified contact dermatitis due to other chemical products: Secondary | ICD-10-CM | POA: Diagnosis not present

## 2021-01-22 MED ORDER — OPZELURA 1.5 % EX CREA: 1.0000 "application " | TOPICAL_CREAM | Freq: Two times a day (BID) | CUTANEOUS | 5 refills | Status: DC | PRN

## 2021-01-22 MED ORDER — METHYLPREDNISOLONE ACETATE 80 MG/ML IJ SUSP
80.0000 mg | Freq: Once | INTRAMUSCULAR | Status: AC
  Administered 2021-01-22: 80 mg via INTRAMUSCULAR

## 2021-01-22 NOTE — Patient Instructions (Addendum)
  1.  Allergen avoidance measures - dye / fragrance, dust mite, tree pollen  2.  Depo-Medrol 80 IM delivered in clinic today  3.  Ozelura - apply to dermatitis 1-2 times per day (specialty pharmacy)  4. Further evaluation and treatment???  5. Return to clinic in 4 weeks or earlier if problem

## 2021-01-22 NOTE — Progress Notes (Signed)
[place] - [place] - [place] - [place] - [place]   NEW PATIENT NOTE  Referring Provider: No ref. provider found Primary Provider: Pcp, No Date of office visit: 01/22/2021    Subjective:   Chief Complaint:  Sheila Salinas (DOB: August 14, 1985) is a 35 y.o. female who presents to the clinic on 01/22/2021 with a chief complaint of Allergic Reaction (She did the patch testing and thinks she is allergic to hair coloring and perfume fragrance. ) and Rash (Started after she used hair coloring. ) .     HPI: Sheila Salinas presents to this clinic in evaluation of dermatitis.  For the past 18 months she has been having a dermatitis affecting her hands.  She goes through cycles of redness and blisters and eruptions and scaling and sloughing.  She never completely resolves this inflammation but definitely goes through cycles that occur about every other week with significant eruptions.  Over the course of the past month she has had progression of this issue involving her wrists and her arms and her chest and her hairline.  She has used her daughter's triamcinolone for about 3 days which resulted in pretty good control of these areas and at this point time she does not have any activity of the problem yet still continues with her hand issue.  For the hand issues she has seen a dermatologist and had patch testing which identified hypersensitivity against dye and fragrance.  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  She was given betamethasone which she uses on a daily basis over the course of the past several months which does help but she still has an active issue with cyclical eruptions.  She does not have any associated systemic or constitutional symptoms.  She might be a little fatigued at this point but she has a very active life.  She does not have any atopic symptoms.  She did have a history of childhood "dry skin".  She was on some supplements  which she discontinued over the course of the past 2 months.  Past Medical History:  Diagnosis Date   Ectopic pregnancy, tubal 05/06/2011   Tx'd with MTX in [facility]    History reviewed. No pertinent surgical history.  Allergies as of 01/22/2021       Reactions   Morphine Nausea And Vomiting   Morphine And Related Nausea And Vomiting   Other         Medication List    betamethasone dipropionate 0.05 % cream SMARTSIG:1 Application Topical 3-4 Times Daily   triamcinolone cream 0.5 % Commonly known as: KENALOG 1 application to affected area    Review of systems negative except as noted in HPI / PMHx or noted below:  Review of Systems  Constitutional: Negative.   HENT: Negative.    Eyes: Negative.   Respiratory: Negative.    Cardiovascular: Negative.   Gastrointestinal: Negative.   Genitourinary: Negative.   Musculoskeletal: Negative.   Skin: Negative.   Neurological: Negative.   Endo/Heme/Allergies: Negative.   Psychiatric/Behavioral: Negative.     History reviewed. No pertinent family history.  Social History   Socioeconomic History   Marital status: Married    Spouse name: Not on file   Number of children: Not on file   Years of education: Not on file   Highest education level: Not on file  Occupational History   Not on file  Tobacco Use   Smoking status: Former  Packs/day: 1.00    Types: Cigarettes    Quit date: 06/18/2010    Years since quitting: 10.6   Smokeless tobacco: Never  Substance and Sexual Activity   Alcohol use: Yes    Comment: Weekly   Drug use: No   Sexual activity: Yes    Birth control/protection: None  Other Topics Concern   Not on file  Social History Narrative   Not on file   Environmental and Social history  Lives in a house with a dry environment, dog and cat located inside the household, no carpet in the bedroom, plastic on the bed, no plastic on the pillow, no smoking on going with inside the household.  She works as a  Associate Professor.  Objective:   Vitals:   01/22/21 1010  BP: 108/72  Pulse: (!) 103  Resp: 16  Temp: 98.2 F (36.8 C)  SpO2: 97%   Height: 5\' 4"  (162.6 cm) Weight: 142 lb 3.2 oz (64.5 kg)  Physical Exam Constitutional:      Appearance: She is not diaphoretic.  HENT:     Head: Normocephalic.     Right Ear: Tympanic membrane, ear canal and external ear normal.     Left Ear: Tympanic membrane, ear canal and external ear normal.     Nose: Nose normal. No mucosal edema or rhinorrhea.     Mouth/Throat:     Pharynx: Uvula midline. No oropharyngeal exudate.  Eyes:     Conjunctiva/sclera: Conjunctivae normal.  Neck:     Thyroid: No thyromegaly.     Trachea: Trachea normal. No tracheal tenderness or tracheal deviation.  Cardiovascular:     Rate and Rhythm: Normal rate and regular rhythm.     Heart sounds: Normal heart sounds, S1 normal and S2 normal. No murmur heard. Pulmonary:     Effort: No respiratory distress.     Breath sounds: Normal breath sounds. No stridor. No wheezing or rales.  Lymphadenopathy:     Head:     Right side of head: No tonsillar adenopathy.     Left side of head: No tonsillar adenopathy.     Cervical: No cervical adenopathy.  Skin:    Findings: Rash (Erythematous lichenified scaly palmar surface hands, right wrist, left antecubital fossa, anterior central chest) present. No erythema.     Nails: There is no clubbing.  Neurological:     Mental Status: She is alert.    Diagnostics: Allergy skin tests were performed.  She demonstrated hypersensitivity to dust mite and hickory.  She did not demonstrate any hypersensitivity against a screening panel of foods.  Assessment and Plan:    1. Other atopic dermatitis   2. Inflammatory dermatosis   3. Contact dermatitis due to chemicals     1.  Allergen avoidance measures - dye / fragrance, dust mite, tree pollen   2.  Depo-Medrol 80 IM delivered in clinic today  3.  Ozelura - apply to dermatitis 1-2 times  per day (specialty pharmacy)  4. Further evaluation and treatment???  5. Return to clinic in 4 weeks or earlier if problem  Debi appears to have an inflammatory dermatosis and given the fact that she did demonstrate atopic disease on skin testing she probably has a component of atopic dermatitis in conjunction with her parents contact dermatitis directed against fragrances and dyes.  We will treat her with the therapy noted above which includes a topical Jak inhibitor and regroup with her in 4 weeks to assess her response.  Should she still be  active we will obtain some screening blood test to look for systemic disease and consider giving her a biologic agent to deal with this issue.  Jessica Priest, MD Allergy / Immunology [facility]

## 2021-01-23 ENCOUNTER — Encounter: Payer: Self-pay | Admitting: Allergy and Immunology

## 2021-01-24 ENCOUNTER — Telehealth: Payer: Self-pay | Admitting: *Deleted

## 2021-01-24 NOTE — Telephone Encounter (Signed)
PA has been submitted through CoverMyMeds for Opzelura and is currently pending approval/denial.  

## 2021-01-25 NOTE — Telephone Encounter (Signed)
PA is still pending.  

## 2021-02-04 NOTE — Telephone Encounter (Signed)
PA is still pending.  

## 2021-02-06 NOTE — Telephone Encounter (Signed)
Patient called and needs to see what is going on with the pa for the Ozelura cream . She has not received anything and the sample that was giving to her is running out. So she did not know if she could get another sample until her came in. [phone].

## 2021-02-06 NOTE — Telephone Encounter (Signed)
Called and left a voicemail asking for patient to return call to discuss. I will resubmit the PA for Opzelura to refresh the process and I can place a sample of Opzelura up front for the patient in the [facility] office tomorrow 02/07/21.

## 2021-02-06 NOTE — Telephone Encounter (Signed)
Patient called back and was advised of PA and sample. Patient verbalized understanding.

## 2021-02-13 NOTE — Telephone Encounter (Signed)
PA has been submitted through CoverMyMeds for Opzelura and is currently pending approval/denial.  

## 2021-02-14 NOTE — Telephone Encounter (Signed)
PA was denied for Opzelura stating that the patient must have a trial and failure of a topical calcineurin inhibitor such as pimecrolimus and tacrolimus. Do you recall if the patient has tried and failed these medications? I did not see it listed in her chart nor mention of it in the note.

## 2021-02-18 ENCOUNTER — Other Ambulatory Visit: Payer: Self-pay | Admitting: *Deleted

## 2021-02-18 NOTE — Telephone Encounter (Signed)
Generic protopic or generic elidel are preferred at $5.00. Called the patient and left a voicemail asking for a return call to advise of needed change in ointment from Opzelura and to confirm which pharmacy she would like for it to be sent to.

## 2021-02-19 NOTE — Telephone Encounter (Signed)
Attempted to call patient, left a voicemail asking for patient to return call to discuss.

## 2021-02-20 MED ORDER — PIMECROLIMUS 1 % EX CREA: TOPICAL_CREAM | Freq: Two times a day (BID) | CUTANEOUS | 1 refills | Status: DC

## 2021-02-20 NOTE — Addendum Note (Signed)
Addended by: Robet Leu A on: 02/20/2021 12:04 PM   Modules accepted: Orders

## 2021-02-20 NOTE — Telephone Encounter (Addendum)
Patient called and we discussed the change in medications. Patient verbalized understanding and she confirmed her pharmacy. Elidel has been sent to her pharmacy.

## 2021-02-26 ENCOUNTER — Ambulatory Visit: Payer: BC Managed Care – PPO | Admitting: Allergy and Immunology

## 2021-02-26 ENCOUNTER — Telehealth: Payer: Self-pay | Admitting: *Deleted

## 2021-02-26 ENCOUNTER — Encounter: Payer: Self-pay | Admitting: Allergy and Immunology

## 2021-02-26 ENCOUNTER — Other Ambulatory Visit: Payer: Self-pay

## 2021-02-26 VITALS — BP 102/68 | HR 70 | Temp 96.6°F | Resp 16 | Ht 64.0 in | Wt 141.6 lb

## 2021-02-26 DIAGNOSIS — L2089 Other atopic dermatitis: Secondary | ICD-10-CM

## 2021-02-26 DIAGNOSIS — L253 Unspecified contact dermatitis due to other chemical products: Secondary | ICD-10-CM

## 2021-02-26 DIAGNOSIS — L989 Disorder of the skin and subcutaneous tissue, unspecified: Secondary | ICD-10-CM | POA: Diagnosis not present

## 2021-02-26 MED ORDER — PIMECROLIMUS 1 % EX CREA: TOPICAL_CREAM | Freq: Two times a day (BID) | CUTANEOUS | 1 refills | Status: DC

## 2021-02-26 MED ORDER — OPZELURA 1.5 % EX CREA: 1.0000 "application " | TOPICAL_CREAM | Freq: Two times a day (BID) | CUTANEOUS | 5 refills | Status: DC | PRN

## 2021-02-26 NOTE — Patient Instructions (Signed)
°  1.  Allergen avoidance measures - dye / fragrance, dust mite, tree pollen  2.  Use the Elidel applied 1-2 times per day until clear, then 3-7 times per week preventatively  3.  If Elidel does not work will use [employer]  4. If Belia Heman does not work will use Dupilumab  5. Contact clinic in 12 weeks with update.

## 2021-02-26 NOTE — Telephone Encounter (Signed)
PA has been submitted through CoverMyMeds for Elidel and is currently pending approval/denial.  

## 2021-02-26 NOTE — Telephone Encounter (Signed)
Insurance determined that a PA was not required since it is covered under the pharmacy plan. This form has been faxed to pharmacy, labeled, and placed in bulk scanning.

## 2021-02-26 NOTE — Progress Notes (Signed)
° °  [place] - [place] - [place] - [place] - [place]   Follow-up Note  Referring Provider: No ref. provider found Primary Provider: Pcp, No Date of Office Visit: 02/26/2021  Subjective:   Sheila Salinas (DOB: 1985-09-03) is a 36 y.o. female who [facility] in re-evaluation of the following:  HPI: Editha returns to this clinic in evaluation of inflammatory dermatosis / atopic dermatitis and contact dermatitis.  Her last visit to this clinic was her initial evaluation of 22 January 2021.  When she used Opzelura her hands and her chest issue cleared up completely.  Unfortunately, her insurance company will not provide her approval for obtaining this agent and they recommended that she use a calcineurin inhibitor.  We called in Elidel but she has yet to use that agent.  She has performed house dust avoidance measures and she remains away from the use of dyes and fragrances.  Allergies as of 02/26/2021       Reactions   Morphine Nausea And Vomiting   Morphine And Related Nausea And Vomiting   Other         Medication List    Opzelura 1.5 % Crea Generic drug: Ruxolitinib Phosphate Apply 1 application topically 2 (two) times daily as needed.   pimecrolimus 1 % cream Commonly known as: ELIDEL Apply topically 2 (two) times daily.    Past Medical History:  Diagnosis Date   Ectopic pregnancy, tubal 05/06/2011   Tx'd with MTX in [facility]    History reviewed. No pertinent surgical history.  Review of systems negative except as noted in HPI / PMHx or noted below:  Review of Systems  Constitutional: Negative.   HENT: Negative.    Eyes: Negative.   Respiratory: Negative.    Cardiovascular: Negative.   Gastrointestinal: Negative.   Genitourinary: Negative.   Musculoskeletal: Negative.   Skin: Negative.   Neurological: Negative.   Endo/Heme/Allergies: Negative.   Psychiatric/Behavioral: Negative.      Objective:   Vitals:    02/26/21 0845  BP: 102/68  Pulse: 70  Resp: 16  Temp: (!) 96.6 F (35.9 C)  SpO2: 99%   Height: 5\' 4"  (162.6 cm)  Weight: 141 lb 9.6 oz (64.2 kg)   Physical Exam Skin:    Findings: Rash (Erythematous lichenified scaly fingers hands wrists anterior chest.) present.    Diagnostics: none  Assessment and Plan:   1. Other atopic dermatitis   2. Inflammatory dermatosis   3. Contact dermatitis due to chemicals     1.  Allergen avoidance measures - dye / fragrance, dust mite, tree pollen  2.  Use the Elidel applied 1-2 times per day until clear, then 3-7 times per week preventatively  3.  If Elidel does not work will use  4. If [employer] does not work will use Dupilumab  5. Contact clinic in 12 weeks with update.   Deara will use a calcineurin inhibitor and if this does not work then will use a Jak inhibitor and if this does not work then will use a anti-IL-4/13 biologic agent to control her inflammatory dermatosis as specified above.  Belia Heman, MD Allergy / Immunology [facility]

## 2021-02-27 ENCOUNTER — Encounter: Payer: Self-pay | Admitting: Allergy and Immunology

## 2021-03-13 ENCOUNTER — Telehealth: Payer: Self-pay

## 2021-03-13 NOTE — Telephone Encounter (Signed)
Pa submitted thru cover my meds for opzelura 1.5% cream waiting on response from insurance

## 2021-06-20 ENCOUNTER — Ambulatory Visit: Payer: Self-pay

## 2021-06-20 NOTE — Telephone Encounter (Signed)
?  Chief Complaint: Left ear pain ?Symptoms: ear pain just inside external ear ?Frequency: pain past 3 days ?Pertinent Negatives: Patient denies fever, dizziness ?Disposition: [] ED /[x] Urgent Care (no appt availability in office) / [] Appointment(In office/virtual)/ []  [facility]/ [] Home Care/ [] Refused Recommended Disposition /[] [place] [facility]/ []  Follow-up with PCP ?Additional Notes: Pt states that ear has been itchy about 1 months. The past 3 days has begun hurting just inside external ear opening.  ? ?Reason for Disposition ? [1] SEVERE pain AND [2] not improved 2 hours after taking analgesic medication (e.g., ibuprofen or acetaminophen) ? ?Answer Assessment - Initial Assessment Questions ?1. LOCATION: "Which ear is involved?" ?    left ?2. ONSET: "When did the ear start hurting"  ?    1 month ago - itching. Dry. 2 days ago  - pain ?3. SEVERITY: "How bad is the pain?"  (Scale 1-10; mild, moderate or severe) ?  - MILD (1-3): doesn't interfere with normal activities  ?  - MODERATE (4-7): interferes with normal activities or awakens from sleep  ?  - SEVERE (8-10): excruciating pain, unable to do any normal activities  ?    8-9/10 ?4. URI SYMPTOMS: "Do you have a runny nose or cough?" ?    no ?5. FEVER: "Do you have a fever?" If Yes, ask: "What is your temperature, how was it measured, and when did it start?" ?    no ?6. CAUSE: "Have you been swimming recently?", "How often do you use Q-TIPS?", "Have you had any recent air travel or scuba diving?" ?    no ?7. OTHER SYMPTOMS: "Do you have any other symptoms?" (e.g., headache, stiff neck, dizziness, vomiting, runny nose, decreased hearing) ?    no ?8. PREGNANCY: "Is there any chance you are pregnant?" "When was your last menstrual period?" ?    no ? ?Protocols used: Earache-A-AH ? ?

## 2021-06-21 DIAGNOSIS — H9203 Otalgia, bilateral: Secondary | ICD-10-CM | POA: Diagnosis not present

## 2021-06-21 DIAGNOSIS — H60503 Unspecified acute noninfective otitis externa, bilateral: Secondary | ICD-10-CM | POA: Diagnosis not present

## 2021-07-11 ENCOUNTER — Ambulatory Visit: Payer: BC Managed Care – PPO | Admitting: Family Medicine

## 2021-07-11 ENCOUNTER — Encounter: Payer: Self-pay | Admitting: Family Medicine

## 2021-07-11 VITALS — BP 100/70 | HR 85 | Temp 97.7°F | Ht 64.0 in | Wt 139.0 lb

## 2021-07-11 DIAGNOSIS — N939 Abnormal uterine and vaginal bleeding, unspecified: Secondary | ICD-10-CM | POA: Diagnosis not present

## 2021-07-11 DIAGNOSIS — N920 Excessive and frequent menstruation with regular cycle: Secondary | ICD-10-CM

## 2021-07-11 DIAGNOSIS — R5383 Other fatigue: Secondary | ICD-10-CM | POA: Diagnosis not present

## 2021-07-11 DIAGNOSIS — R233 Spontaneous ecchymoses: Secondary | ICD-10-CM

## 2021-07-11 DIAGNOSIS — T148XXA Other injury of unspecified body region, initial encounter: Secondary | ICD-10-CM

## 2021-07-11 LAB — CBC WITH DIFFERENTIAL/PLATELET
Basophils Absolute: 0 10*3/uL (ref 0.0–0.1)
Basophils Relative: 0.8 % (ref 0.0–3.0)
Eosinophils Absolute: 0.1 10*3/uL (ref 0.0–0.7)
Eosinophils Relative: 1.7 % (ref 0.0–5.0)
HCT: 36.8 % (ref 36.0–46.0)
Hemoglobin: 12.4 g/dL (ref 12.0–15.0)
Lymphocytes Relative: 35.1 % (ref 12.0–46.0)
Lymphs Abs: 2 10*3/uL (ref 0.7–4.0)
MCHC: 33.7 g/dL (ref 30.0–36.0)
MCV: 92.6 fl (ref 78.0–100.0)
Monocytes Absolute: 0.5 10*3/uL (ref 0.1–1.0)
Monocytes Relative: 9.3 % (ref 3.0–12.0)
Neutro Abs: 3 10*3/uL (ref 1.4–7.7)
Neutrophils Relative %: 53.1 % (ref 43.0–77.0)
Platelets: 295 10*3/uL (ref 150.0–400.0)
RBC: 3.97 Mil/uL (ref 3.87–5.11)
RDW: 12.2 % (ref 11.5–15.5)
WBC: 5.7 10*3/uL (ref 4.0–10.5)

## 2021-07-11 LAB — VITAMIN D 25 HYDROXY (VIT D DEFICIENCY, FRACTURES): VITD: 28.84 ng/mL — ABNORMAL LOW (ref 30.00–100.00)

## 2021-07-11 LAB — COMPREHENSIVE METABOLIC PANEL
ALT: 15 U/L (ref 0–35)
AST: 20 U/L (ref 0–37)
Albumin: 4.6 g/dL (ref 3.5–5.2)
Alkaline Phosphatase: 39 U/L (ref 39–117)
BUN: 15 mg/dL (ref 6–23)
CO2: 28 mEq/L (ref 19–32)
Calcium: 9.6 mg/dL (ref 8.4–10.5)
Chloride: 103 mEq/L (ref 96–112)
Creatinine, Ser: 0.74 mg/dL (ref 0.40–1.20)
GFR: 102.97 mL/min (ref >60.00)
Glucose, Bld: 89 mg/dL (ref 70–99)
Potassium: 4.5 mEq/L (ref 3.5–5.1)
Sodium: 137 mEq/L (ref 135–145)
Total Bilirubin: 0.7 mg/dL (ref 0.2–1.2)
Total Protein: 7.4 g/dL (ref 6.0–8.3)

## 2021-07-11 LAB — VITAMIN B12: Vitamin B-12: 193 pg/mL — ABNORMAL LOW (ref 211–911)

## 2021-07-11 LAB — T3, FREE: T3, Free: 2.5 pg/mL (ref 2.3–4.2)

## 2021-07-11 LAB — HCG, QUANTITATIVE, PREGNANCY: Quantitative HCG: 1.05 m[IU]/mL

## 2021-07-11 LAB — T4, FREE: Free T4: 0.88 ng/dL (ref 0.60–1.60)

## 2021-07-11 LAB — TSH: TSH: 1.16 u[IU]/mL (ref 0.35–5.50)

## 2021-07-11 NOTE — Assessment & Plan Note (Signed)
Check labs including platelets 

## 2021-07-11 NOTE — Progress Notes (Signed)
New Patient Office Visit  Subjective    Patient ID: Sheila Salinas, female    DOB: December 17, 1985  Age: 36 y.o. MRN: [id number]  CC:  Chief Complaint  Patient presents with   Establish Care    Would like to discuss menstrual cycle as she has been having heavy bleeding at times. Also would like to discuss recent moods feeling "on edge" but not sure if it is due to her age.    HPI Sheila Salinas presents to establish care  She has been going to [facility].   Complains of heavy menstrual bleeding.  Usually she bleeds for 4 days. She has been having heavy bleeding, clots and having to change her tampon hourly which is still not good enough. This has been ongoing for the past 6-7 years.   Bruises easily.   Feeling tired often. Having to take naps.   Denies fever, chills, dizziness, chest pain, palpitations, shortness of breath, abdominal pain, N/V/D, urinary symptoms, LE edema.    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   Mood has been changing and feeling more on edge recently.     Outpatient Encounter Medications as of 07/11/2021  Medication Sig   [DISCONTINUED] betamethasone dipropionate 0.05 % cream SMARTSIG:1 Application Topical 3-4 Times Daily   [DISCONTINUED] pimecrolimus (ELIDEL) 1 % cream Apply topically 2 (two) times daily.   [DISCONTINUED] Ruxolitinib Phosphate (OPZELURA) 1.5 % CREA Apply 1 application topically 2 (two) times daily as needed.   [DISCONTINUED] triamcinolone cream (KENALOG) 0.5 % 1 application to affected area   No facility-administered encounter medications on file as of 07/11/2021.    Past Medical History:  Diagnosis Date   Ectopic pregnancy, tubal 05/06/2011   Tx'd with MTX in [facility]    History reviewed. No pertinent surgical history.  Family History  Problem Relation Age of Onset   Alcohol abuse Mother    Depression Mother    Miscarriages / [place] Mother    Alcohol abuse Father    Cancer Father    Drug abuse Father     Early death Maternal Grandmother    High Cholesterol Maternal Grandmother    High blood pressure Maternal Grandmother    Stroke Maternal Grandmother     Social History   Socioeconomic History   Marital status: Married    Spouse name: Not on file   Number of children: Not on file   Years of education: Not on file   Highest education level: Not on file  Occupational History   Not on file  Tobacco Use   Smoking status: Former    Packs/day: 1.00    Types: Cigarettes    Quit date: 06/18/2010    Years since quitting: 11.0   Smokeless tobacco: Never  Substance and Sexual Activity   Alcohol use: Yes    Comment: Weekly   Drug use: No   Sexual activity: Yes    Birth control/protection: None  Other Topics Concern   Not on file  Social History Narrative   Not on file   Social Determinants of Health   Financial Resource Strain: Not on file  Food Insecurity: Not on file  Transportation Needs: Not on file  Physical Activity: Not on file  Stress: Not on file  Social Connections: Not on file  Intimate Partner Violence: Not on file    ROS Pertinent positives and negatives in the history of present illness.      Objective    BP 100/70 (BP Location:  Left Arm, Patient Position: Sitting, Cuff Size: Large)   Pulse 85   Temp 97.7 F (36.5 C) (Temporal)   Ht 5\' 4"  (1.626 m)   Wt 139 lb (63 kg)   LMP 06/27/2021   SpO2 98%   BMI 23.86 kg/m   Physical Exam Constitutional:      Appearance: Normal appearance.  Eyes:     Extraocular Movements: Extraocular movements intact.     Conjunctiva/sclera: Conjunctivae normal.     Pupils: Pupils are equal, round, and reactive to light.  Cardiovascular:     Rate and Rhythm: Normal rate and regular rhythm.     Pulses: Normal pulses.  Pulmonary:     Effort: Pulmonary effort is normal.     Breath sounds: Normal breath sounds.  Abdominal:     General: Abdomen is flat. Bowel sounds are normal. There is no distension.     Palpations:  Abdomen is soft.     Tenderness: There is no abdominal tenderness. There is no guarding or rebound.  Musculoskeletal:        General: Normal range of motion.     Cervical back: Normal range of motion and neck supple.  Skin:    General: Skin is warm and dry.  Neurological:     General: No focal deficit present.     Mental Status: She is alert and oriented to person, place, and time.  Psychiatric:        Mood and Affect: Mood normal.        Behavior: Behavior normal.        Thought Content: Thought content normal.        Assessment & Plan:   Problem List Items Addressed This Visit       Genitourinary   Abnormal uterine bleeding (AUB) - Primary    Significant changes in her menstrual cycle.  Check labs including TSH and refer to OB/GYN.  She will call and schedule her appointment.       Relevant Orders   CBC with Differential/Platelet   Comprehensive metabolic panel   TSH   T3, free   T4, free   Iron, TIBC and Ferritin Panel   B-HCG Quant     Other   Bruising    Check labs including platelets       Relevant Orders   CBC with Differential/Platelet   Fatigue    May be multifactorial.  Screen for anemia in setting of heavy menstrual bleeding.  Check labs and follow-up.       Relevant Orders   CBC with Differential/Platelet   Comprehensive metabolic panel   TSH   T3, free   T4, free   VITAMIN D 25 Hydroxy (Vit-D Deficiency, Fractures)   Vitamin B12   Menorrhagia with regular cycle    Check labs including TSH and refer to OB/GYN.  She will call and schedule her appointment       Relevant Orders   CBC with Differential/Platelet   Comprehensive metabolic panel   TSH   T3, free   T4, free   Iron, TIBC and Ferritin Panel    Return for pending labs.   08/27/2021, NP-C

## 2021-07-11 NOTE — Patient Instructions (Signed)
Obgyn Offices:   [facility] [address] [place], [place] [address] [phone]  [facility] Address: [address] [place], [place] [address] Phone: [phone]  [facility] [address] [place], [place] [address] Phone: [phone]   [facility] [address] [place], [place] [address] Phone: [phone] 

## 2021-07-11 NOTE — Assessment & Plan Note (Signed)
Significant changes in her menstrual cycle.  Check labs including TSH and refer to OB/GYN.  She will call and schedule her appointment.

## 2021-07-11 NOTE — Assessment & Plan Note (Signed)
Check labs including TSH and refer to OB/GYN.  She will call and schedule her appointment

## 2021-07-11 NOTE — Assessment & Plan Note (Signed)
May be multifactorial.  Screen for anemia in setting of heavy menstrual bleeding.  Check labs and follow-up.

## 2021-07-12 LAB — IRON,TIBC AND FERRITIN PANEL
%SAT: 20 % (calc) (ref 16–45)
Ferritin: 13 ng/mL — ABNORMAL LOW (ref 16–154)
Iron: 85 ug/dL (ref 40–190)
TIBC: 429 mcg/dL (calc) (ref 250–450)

## 2021-07-12 NOTE — Progress Notes (Signed)
Her vitamin B12 is low. I recommend getting B12 injections, once weekly, for the next month or she can go ahead and start on oral B12 1,000 mcg daily on an empty stomach if she prefers. Let me know please. Her vitamin D is also low so start on vitamin D3 1,000 IUs over the counter once daily or if she is already taking a D3 supplement, increase the dose by 1,000 IUs daily. Her iron stores are low and I will have her discuss this with her OB/GYN.

## 2021-08-23 ENCOUNTER — Telehealth: Payer: Self-pay | Admitting: Allergy and Immunology

## 2021-08-23 NOTE — Telephone Encounter (Signed)
Patient states since her last OV she feels that her symptoms are worsening. The medication is not helping and she now has nasal congestion, her hands are flaring up, her face is itchy and she has been out of work for 2 weeks. Elidel is not working for her but the Opzelura samples did help. She ran out of it and her INS is not covering. She also started taking Claritin.

## 2021-08-23 NOTE — Telephone Encounter (Signed)
Routed to Dr. Kozlow 

## 2021-08-26 ENCOUNTER — Telehealth: Payer: Self-pay

## 2021-08-26 NOTE — Telephone Encounter (Signed)
Patient called in - DOB verified - requesting another sample of Opzelura cream. Patient stated Pimecrolimus (Elidel) 1% cream is not helping at all - her hands are bad! Patient stated the Opzelura cream helps a lot as well as clears any issues up on her hands. Patient stated her insurance will not cover Opzelura whih is why she's requesting another sample.   Patient advised message will be forwarded provider for review. Once provider responds the office will contact her. Patient verbalized understanding, no further questions.

## 2021-08-26 NOTE — Telephone Encounter (Signed)
Called patient - DPR verified - Left following message:   Please contact Sheila Salinas and let her know that we will submit for Dupilumab (Dupixent)  approval for her atopic dermatitis and she can come in and get a sample to start this agent.   Patient was advised to contact office to schedule appt for consult regarding injection and sign consent form if she has not already.

## 2021-08-28 ENCOUNTER — Ambulatory Visit (INDEPENDENT_AMBULATORY_CARE_PROVIDER_SITE_OTHER): Payer: BC Managed Care – PPO

## 2021-08-28 DIAGNOSIS — L209 Atopic dermatitis, unspecified: Secondary | ICD-10-CM | POA: Diagnosis not present

## 2021-08-28 DIAGNOSIS — L2089 Other atopic dermatitis: Secondary | ICD-10-CM

## 2021-08-28 MED ORDER — EPINEPHRINE 0.3 MG/0.3ML IJ SOAJ: 0.3000 mg | Freq: Once | INTRAMUSCULAR | 2 refills | Status: AC

## 2021-08-28 MED ORDER — DUPILUMAB 300 MG/2ML ~~LOC~~ SOSY
600.0000 mg | PREFILLED_SYRINGE | Freq: Once | SUBCUTANEOUS | Status: AC
  Administered 2021-08-28: 600 mg via SUBCUTANEOUS

## 2021-08-28 NOTE — Progress Notes (Signed)
Immunotherapy   Patient Details  Name: Sheila Salinas MRN: [id number] Date of Birth: 10/11/1985  08/28/2021  [name] came in to get a sample of Dupixent. 600mg . [name] waited in the office for 30 minutes without any reactions. Following schedule: is to be determined   Frequency: is to be determined Epi-Pen:Prescription for Epi-Pen given  Consent signed and patient instructions given.   08/28/2021, 2:07 PM

## 2021-09-02 ENCOUNTER — Ambulatory Visit: Payer: BC Managed Care – PPO | Admitting: Family Medicine

## 2021-09-02 ENCOUNTER — Encounter: Payer: Self-pay | Admitting: Family Medicine

## 2021-09-02 VITALS — BP 110/70 | HR 88 | Temp 98.2°F | Resp 22

## 2021-09-02 DIAGNOSIS — J3089 Other allergic rhinitis: Secondary | ICD-10-CM | POA: Diagnosis not present

## 2021-09-02 DIAGNOSIS — J302 Other seasonal allergic rhinitis: Secondary | ICD-10-CM | POA: Insufficient documentation

## 2021-09-02 DIAGNOSIS — L2084 Intrinsic (allergic) eczema: Secondary | ICD-10-CM

## 2021-09-02 DIAGNOSIS — L282 Other prurigo: Secondary | ICD-10-CM

## 2021-09-02 MED ORDER — OPZELURA 1.5 % EX CREA: 1.0000 | TOPICAL_CREAM | Freq: Two times a day (BID) | CUTANEOUS | 5 refills | Status: DC | PRN

## 2021-09-02 NOTE — Patient Instructions (Addendum)
Inflammatory dermatosis flare Begin prednisone 10 mg tablets. Take 2 tablets twice a day for 3 days, then take 2 tablets once a day for 1 day, then take 1 tablet on the 5th day, then stop  Begin Opezlura up to twice a day as needed to red and itchy areas  Allergic rhinitis Continue allergen avoidance measures directed toward dust mite and tree pollen as listed below Continue an antihistamine once a day as needed for runny nose or itch Consider saline nasal rinses as needed for nasal symptoms. Use this before any medicated nasal sprays for best result  Hives (urticaria) Recently been out of medications while remaining hive and itch free Allegra 180 mg twice a day and famotidine (Pepcid) 20 mg twice a day. If no symptoms for 7-14 days then decrease to. Allegra 180 mg twice a day and famotidine (Pepcid) 20 mg once a day.  If no symptoms for 7-14 days then decrease to. Allegra 180 mg twice a day.  If no symptoms for 7-14 days then decrease to. Allegra 180 mg once a day.  May use Benadryl (diphenhydramine) as needed for breakthrough hives       If symptoms return, then step up dosage  Keep a detailed symptom journal including foods eaten, contact with allergens, medications taken, weather changes.  Prescription foreee.   Call the clinic if this treatment plan is not working well for you  Follow up in 1 month or sooner if needed.

## 2021-09-02 NOTE — Telephone Encounter (Signed)
Need documentation for Elidel failure to get approval. Spoke to Aura Camps seeing patient today in clinic to advise same and will move forward with Dupixent submit

## 2021-09-02 NOTE — Progress Notes (Signed)
[address] [place] [address] Dept: [phone]  FOLLOW UP NOTE  Patient ID: Sheila Salinas, female    DOB: [birth year]  Age: 36 y.o. MRN: [id number] Date of Office Visit: 09/02/2021  Assessment  Chief Complaint: Allergic Reaction (Face and eye swelling and rash that started Thursday )  HPI Sheila Salinas is a 36 year old female who presents to the clinic for a follow up visit. She was last seen in this clinic on 02/26/2021 by Dr. Lucie Leather for evaluation of dermatitis.  In the interim, she has tried Elidel with no relief of symptoms and Opzelura with relief of symptoms, however, her insurance will not cover Opzelura.  At today's visit, she reports that July 4 she slept in the room that had been shared with the cat and also may have had an increase in dust mite exposure.  She reports that she began to experience redness, itching, and swelling on her hands as well as nasal congestion. At that time, she was using Elidel twice a day for her hands with no relief of symptoms. She reports that she called our clinic on 08/23/2021 with a report of worsening symptoms and Dupixent was ordered. She received the first dose of Dupixent on 08/28/2021 and reports that the following day she began to experience red, raised itchy areas on her face, chest, and shoulders with mild swelling of her eyelids. At that time, she began taking an over the counter antihistamine and 5 mg of prednisone that she found at her home with mild relief of symptoms.  She denies concomitant cardiopulmonary or gastrointestinal symptoms with the hives and angioedema.  She denies new personal care products, medications, insect stings, or viral illness.  Her last environmental allergy testing was on 01/22/2021 and was positive to tree pollen and dust mites.  Food allergy testing was negative to the panel on 01/22/2021.  She has previously seen a dermatologist and had patch testing which indicated hypersensitivity toward Dyanna and fragrance.  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  Her current medications are listed in the chart.    Drug Allergies:  Allergies  Allergen Reactions   Morphine Nausea And Vomiting   Morphine And Related Nausea And Vomiting   Other     Physical Exam: BP 110/70   Pulse 88   Temp 98.2 F (36.8 C) (Temporal)   Resp (!) 22   SpO2 98%    Physical Exam Vitals reviewed.  Constitutional:      Appearance: Normal appearance.  HENT:     Head: Normocephalic and atraumatic.     Right Ear: Tympanic membrane normal.     Left Ear: Tympanic membrane normal.     Nose:     Comments: Bilateral nares normal.  Pharynx normal.  Ears normal.  Eyes normal.    Mouth/Throat:     Pharynx: Oropharynx is clear.  Eyes:     Conjunctiva/sclera: Conjunctivae normal.  Cardiovascular:     Rate and Rhythm: Normal rate and regular rhythm.     Heart sounds: Normal heart sounds. No murmur heard. Pulmonary:     Effort: Pulmonary effort is normal.     Breath sounds: Normal breath sounds.     Comments: Lungs clear to auscultation Musculoskeletal:     Cervical back: Normal range of motion and neck supple.  Skin:    General: Skin is warm and dry.     Comments: Erythematous patches scattered  across her face, chest, and upper arms.  Erythematous lichenified skin on her fingers and wrists.  No open areas noted  Neurological:     Mental Status: She is alert and oriented to person, place, and time.  Psychiatric:        Mood and Affect: Mood normal.        Behavior: Behavior normal.        Thought Content: Thought content normal.        Judgment: Judgment normal.          Assessment and Plan: 1. Intrinsic atopic dermatitis   2. Seasonal and perennial allergic rhinitis   3. Papular urticaria     Meds ordered this encounter  Medications   Ruxolitinib Phosphate (OPZELURA) 1.5 % CREA    Sig: Apply 1 Application topically 2 (two) times daily as needed.    Dispense:   60 g    Refill:  5    Failed Dupixent    Patient Instructions  Inflammatory dermatosis flare Begin prednisone 10 mg tablets. Take 2 tablets twice a day for 3 days, then take 2 tablets once a day for 1 day, then take 1 tablet on the 5th day, then stop  Begin Opezlura up to twice a day as needed to red and itchy areas  Allergic rhinitis Continue allergen avoidance measures directed toward dust mite and tree pollen as listed below Continue an antihistamine once a day as needed for runny nose or itch Consider saline nasal rinses as needed for nasal symptoms. Use this before any medicated nasal sprays for best result  Hives (urticaria) Recently been out of medications while remaining hive and itch free Allegra 180 mg twice a day and famotidine (Pepcid) 20 mg twice a day. If no symptoms for 7-14 days then decrease to. Allegra 180 mg twice a day and famotidine (Pepcid) 20 mg once a day.  If no symptoms for 7-14 days then decrease to. Allegra 180 mg twice a day.  If no symptoms for 7-14 days then decrease to. Allegra 180 mg once a day.  May use Benadryl (diphenhydramine) as needed for breakthrough hives       If symptoms return, then step up dosage  Keep a detailed symptom journal including foods eaten, contact with allergens, medications taken, weather changes.  Prescription foreee.   Call the clinic if this treatment plan is not working well for you  Follow up in 1 month or sooner if needed.   Return in about 4 weeks (around 09/30/2021), or if symptoms worsen or fail to improve.    Thank you for the opportunity to care for this patient.  Please do not hesitate to contact me with questions.  Thermon Leyland, FNP [facility]

## 2021-09-05 ENCOUNTER — Telehealth: Payer: Self-pay

## 2021-09-05 NOTE — Telephone Encounter (Signed)
PA SUBmitted thru cover my meds for opzelura key [id number]

## 2021-09-06 NOTE — Telephone Encounter (Signed)
Pa was denied as pt hs to try and fail bethamethasone, hydcortisone and triamcinolone

## 2021-09-06 NOTE — Telephone Encounter (Signed)
She has previously tried betamethasone and triamcinolone.  Lets try the hydrocortisone and then resubmit. Thank you

## 2021-09-09 ENCOUNTER — Telehealth: Payer: Self-pay | Admitting: Family Medicine

## 2021-09-09 MED ORDER — HYDROCORTISONE 2.5 % EX OINT: TOPICAL_OINTMENT | Freq: Two times a day (BID) | CUTANEOUS | 0 refills | Status: AC

## 2021-09-09 NOTE — Telephone Encounter (Signed)
Patient called in reference to RX Ruxolitinib Phosphate (OPZELURA) 1.5 % CREA [[id number]]  stating her insurance will not pay for this medication Thermon Leyland FMP was made ware of this issue Thurston Hole stated she would write a letter stating the patient needed this medication to see if this could be approved patient calling in checking on this letter please advise

## 2021-09-09 NOTE — Telephone Encounter (Signed)
Informed pt of other telephone message insurance wants her to fail hydrocortison bethamethasone and triamcinolone and has alreaady faild bethamemetasone and triamcinolone so will try hydrocortisone and then go from there

## 2021-09-09 NOTE — Addendum Note (Signed)
Addended by: Berna Bue on: 09/09/2021 09:41 AM   Modules accepted: Orders

## 2021-09-09 NOTE — Telephone Encounter (Signed)
Lm explaining the change and if she fails this one we will attempt pa again to opzleura

## 2021-09-13 MED ORDER — DUPILUMAB 300 MG/2ML ~~LOC~~ SOSY
300.0000 mg | PREFILLED_SYRINGE | Freq: Once | SUBCUTANEOUS | Status: AC
  Administered 2021-08-28: 300 mg via SUBCUTANEOUS

## 2021-09-13 NOTE — Addendum Note (Signed)
Addended by: Devoria Glassing on: 09/13/2021 03:21 PM   Modules accepted: Orders

## 2021-09-18 NOTE — Telephone Encounter (Signed)
Reaction to Dupixent- d/c same

## 2021-10-09 ENCOUNTER — Ambulatory Visit: Payer: BC Managed Care – PPO | Admitting: Family Medicine

## 2021-10-22 ENCOUNTER — Encounter: Payer: Self-pay | Admitting: Family Medicine

## 2021-10-22 ENCOUNTER — Ambulatory Visit: Payer: BC Managed Care – PPO | Admitting: Family Medicine

## 2021-10-22 VITALS — BP 114/70 | HR 74 | Temp 98.2°F | Resp 16 | Ht 63.0 in | Wt 145.2 lb

## 2021-10-22 DIAGNOSIS — J3089 Other allergic rhinitis: Secondary | ICD-10-CM | POA: Diagnosis not present

## 2021-10-22 DIAGNOSIS — J302 Other seasonal allergic rhinitis: Secondary | ICD-10-CM

## 2021-10-22 DIAGNOSIS — L253 Unspecified contact dermatitis due to other chemical products: Secondary | ICD-10-CM | POA: Diagnosis not present

## 2021-10-22 DIAGNOSIS — L2084 Intrinsic (allergic) eczema: Secondary | ICD-10-CM

## 2021-10-22 DIAGNOSIS — L282 Other prurigo: Secondary | ICD-10-CM

## 2021-10-22 DIAGNOSIS — L989 Disorder of the skin and subcutaneous tissue, unspecified: Secondary | ICD-10-CM | POA: Insufficient documentation

## 2021-10-22 NOTE — Patient Instructions (Addendum)
Inflammatory dermatosis flare Continue a twice a day moisturizing routine Begin Opezlura up to twice a day as needed to red and itchy areas Continue hydrocortisone 2.5% cream up to twice a day as needed Continue triamcinolone 0.1% ointment to red and itchy areas underneath your face up to twice a day as needed.  Do not use this medication for longer than 2 weeks in a row Continue betamethasone up to twice a day to stubborn red, itchy areas below our face. So not use this medication for longer than 2 weeks in a row  Allergic rhinitis Continue allergen avoidance measures directed toward dust mite and tree pollen as listed below Continue an antihistamine once a day as needed for runny nose or itch Consider saline nasal rinses as needed for nasal symptoms. Use this before any medicated nasal sprays for best result  Hives (urticaria) Begin prednisone 10 mg tablets. Take 2 tablets twice a day for 3 days, then take 2 tablets once a day for 1 day, then take 1 tablet on the 5th day, then stop  Use the least amount of medications while remaining hive and itch free Allegra 180 mg twice a day and famotidine (Pepcid) 20 mg twice a day. If no symptoms for 7-14 days then decrease to. Allegra 180 mg twice a day and famotidine (Pepcid) 20 mg once a day.  If no symptoms for 7-14 days then decrease to. Allegra 180 mg twice a day.  If no symptoms for 7-14 days then decrease to. Allegra 180 mg once a day.  May use Benadryl (diphenhydramine) as needed for breakthrough hives       If symptoms return, then step up dosage We have ordered some lab work to help us evaluate your hives. We will call you when the results become available Keep a detailed symptom journal including foods eaten, contact with allergens, medications taken, weather changes.  Prescription foreee.   Call the clinic if this treatment plan is not working well for you  Follow up in 1 month or sooner if needed.  Skin care recommendations    Bath time: Always use lukewarm water. AVOID very hot or cold water. Keep bathing time to 5-10 minutes. Do NOT use bubble bath. Use a mild soap and use just enough to wash the dirty areas. Do NOT scrub skin vigorously.  After bathing, pat dry your skin with a towel. Do NOT rub or scrub the skin.   Moisturizers and prescriptions:  ALWAYS apply moisturizers immediately after bathing (within 3 minutes). This helps to lock-in moisture. Use the moisturizer several times a day over the whole body. Good summer moisturizers include: Aveeno, CeraVe, Cetaphil. Good winter moisturizers include: Aquaphor, Vaseline, Cerave, Cetaphil, Eucerin, Vanicream. When using moisturizers along with medications, the moisturizer should be applied about one hour after applying the medication to prevent diluting effect of the medication or moisturize around where you applied the medications. When not using medications, the moisturizer can be continued twice daily as maintenance.   Laundry and clothing: Avoid laundry products with added color or perfumes. Use unscented hypo-allergenic laundry products such as Tide free, Cheer free & gentle, and All free and clear.  If the skin still seems dry or sensitive, you can try double-rinsing the clothes. Avoid tight or scratchy clothing such as wool. Do not use fabric softeners or dyer sheets.  

## 2021-10-22 NOTE — Progress Notes (Signed)
[address] [place] [address] Dept: [phone]  FOLLOW UP NOTE  Patient ID: Sheila Salinas, female    DOB: [birth year]  Age: 36 y.o. MRN: [id number] Date of Office Visit: 10/22/2021  Assessment  Chief Complaint: Intrinsic atopic dermatitis (1 mth f/u - Patient states she good except for her hands - hit/miss)  HPI Sheila Salinas Sheila Salinas is a 36 year old female who presents to the clinic for follow-up visit.  She was last seen in this clinic on 09/02/2021 for evaluation of inflammatory dermatosis flare and allergic rhinitis.  She has previously tried betamethasone and triamcinolone and was last prescribed hydrocortisone at her last appointment.  At today's visit, she reports her atopic dermatitis continues to occur in a flare in remission pattern with red and itchy areas occurring mainly on her hands.  She continues a twice a day moisturizing routine and has tried Elidel, betamethasone, triamcinolone, hydrocortisone, and Dupixent.  She did gain full relief while using Opzelura, however this has not been covered by her insurance as of this time.  Allergic rhinitis is reported as moderately well controlled with occasional symptoms including nasal congestion and clear rhinorrhea for which she takes an antihistamine as needed as well as nasal saline rinses as needed.  Her last environmental allergy skin testing was on 01/22/2021 and was positive to dust mite and tree pollen.  She denies any hive breaks since her last visit to this clinic.  She has previously seen a dermatologist and had patch testing which indicated hypersensitivity toward fragrance and dye.  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  Her medications are listed in the chart.  Drug Allergies:  Allergies  Allergen Reactions   Morphine Nausea And Vomiting   Morphine And Related Nausea And Vomiting   Other     Physical Exam: BP 114/70   Pulse 74   Temp 98.2 F (36.8 C)    Resp 16   Ht 5\' 3"  (1.6 m)   Wt 145 lb 3.2 oz (65.9 kg)   SpO2 96%   BMI 25.72 kg/m    Physical Exam Vitals reviewed.  Constitutional:      Appearance: Normal appearance.  HENT:     Head: Normocephalic and atraumatic.     Right Ear: Tympanic membrane normal.     Left Ear: Tympanic membrane normal.     Nose:     Comments: Bilateral nares slightly erythematous with clear nasal drainage noted.  Pharynx normal.  Ears normal.  Eyes normal.    Mouth/Throat:     Pharynx: Oropharynx is clear.  Eyes:     Conjunctiva/sclera: Conjunctivae normal.  Cardiovascular:     Rate and Rhythm: Normal rate and regular rhythm.     Heart sounds: Normal heart sounds. No murmur heard. Pulmonary:     Effort: Pulmonary effort is normal.     Breath sounds: Normal breath sounds.     Comments: Lungs clear to auscultation Musculoskeletal:        General: Normal range of motion.     Cervical back: Normal range of motion and neck supple.  Skin:    General: Skin is warm and dry.  Neurological:     Mental Status: She is alert and oriented to person, place, and time.  Psychiatric:        Mood and Affect: Mood normal.        Behavior: Behavior normal.        Thought Content: Thought  content normal.        Judgment: Judgment normal.     Assessment and Plan: 1. Intrinsic atopic dermatitis   2. Seasonal and perennial allergic rhinitis   3. 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   Patient Instructions  Inflammatory dermatosis flare Continue a twice a day moisturizing routine Begin Opezlura up to twice a day as needed to red and itchy areas Continue hydrocortisone 2.5% cream up to twice a day as needed Continue triamcinolone 0.1% ointment to red and itchy areas underneath your face up to twice a day as needed.  Do not use this medication for longer than 2 weeks in a row  Allergic rhinitis Continue allergen  avoidance measures directed toward dust mite and tree pollen as listed below Continue an antihistamine once a day as needed for runny nose or itch Consider saline nasal rinses as needed for nasal symptoms. Use this before any medicated nasal sprays for best result  Hives (urticaria) Use the least amount of medications while remaining hive and itch free Allegra 180 mg twice a day and famotidine (Pepcid) 20 mg twice a day. If no symptoms for 7-14 days then decrease to. Allegra 180 mg twice a day and famotidine (Pepcid) 20 mg once a day.  If no symptoms for 7-14 days then decrease to. Allegra 180 mg twice a day.  If no symptoms for 7-14 days then decrease to. Allegra 180 mg once a day.  May use Benadryl (diphenhydramine) as needed for breakthrough hives       If symptoms return, then step up dosage  Keep a detailed symptom journal including foods eaten, contact with allergens, medications taken, weather changes.  Prescription foreee.   Call the clinic if this treatment plan is not working well for you  Follow up in 1 month or sooner if needed.   Return in about 4 weeks (around 11/19/2021), or if symptoms worsen or fail to improve.    Thank you for the opportunity to care for this patient.  Please do not hesitate to contact me with questions.  Thermon Leyland, FNP [facility]

## 2021-11-04 ENCOUNTER — Ambulatory Visit: Payer: BC Managed Care – PPO | Admitting: Family Medicine

## 2021-11-20 NOTE — Progress Notes (Signed)
[place] [address] Dept: [phone]  FOLLOW UP NOTE  Patient ID: Sheila Salinas, female    DOB: 1986/02/02  Age: 36 y.o. MRN: [id number] Date of Office Visit: 11/21/2021  Assessment  Chief Complaint: Intrinsic atopic dermatitis (1 mth f/u - Patient states when she uses hydrocortisone cream on her hands - she reacts on her face, neck and chest)  HPI Sheila Salinas is a 36 year old female who presents to the clinic for a follow up visit. She was last seen in this clinic on 10/22/2021 by Gareth Morgan, FNP, for evaluation of atopic dermatitis, allergic rhinitis, and urticaria. At today's visit, she reports that she began to experience red and itchy areas occurring on her hands that began on September 16 for which she continued using a daily moisturizing routine and hydrocortisone. She reports that after a few days her hands began to heal and she developed a red, raised itchy rash on her face, neck and torso.  She has experienced a raised, red, itchy rash occurring on her face, neck, and torso previously.  She has previously seen a dermatologist and had patch testing which identified sensitivity against dye and fragrance.  She does work as a Haematologist.  She does report being in an environment with an increased amount of dust and has taken Allegra in an effort to decrease itching and some nasal drainage.  Her last environmental allergy skin testing was on 01/22/2021 and was positive to dust mites and tree pollen.  Her current medications are listed in the chart.   Drug Allergies:  Allergies  Allergen Reactions   Morphine Nausea And Vomiting   Morphine And Related Nausea And Vomiting   Other     Physical Exam: BP 110/70   Pulse 79   Temp 98 F (36.7 C)   Resp 16   Ht 5\' 4"  (1.626 m)   Wt 148 lb (67.1 kg)   SpO2 99%   BMI 25.40 kg/m    Physical Exam Vitals reviewed.  Constitutional:      Appearance: Normal appearance.  HENT:     Head: Normocephalic and atraumatic.      Right Ear: Tympanic membrane normal.     Left Ear: Tympanic membrane normal.     Nose:     Comments: Bilateral nares slightly erythematous with clear nasal drainage noted.  Pharynx normal.  Ears normal.  Eyes normal.    Mouth/Throat:     Pharynx: Oropharynx is clear.  Eyes:     Conjunctiva/sclera: Conjunctivae normal.  Cardiovascular:     Rate and Rhythm: Normal rate and regular rhythm.     Heart sounds: Normal heart sounds. No murmur heard. Pulmonary:     Effort: Pulmonary effort is normal.     Breath sounds: Normal breath sounds.     Comments: Lungs clear to auscultation Musculoskeletal:        General: Normal range of motion.     Cervical back: Normal range of motion and neck supple.  Skin:    Comments: Scattered patches of red, raised, itchy areas occurring on her face especially near her left eye, neck and jawline, and upper chest.  No open areas or drainage noted.  Neurological:     Mental Status: She is alert and oriented to person, place, and time.  Psychiatric:        Mood and Affect: Mood normal.        Behavior: Behavior normal.  Thought Content: Thought content normal.        Judgment: Judgment normal.        Assessment and Plan: 1. Chronic urticaria   2. Contact dermatitis due to chemicals   3. Seasonal and perennial allergic rhinitis     No orders of the defined types were placed in this encounter.   Patient Instructions  Inflammatory dermatosis flare Continue a twice a day moisturizing routine Begin Opezlura up to twice a day as needed to red and itchy areas Continue hydrocortisone 2.5% cream up to twice a day as needed Continue triamcinolone 0.1% ointment to red and itchy areas underneath your face up to twice a day as needed.  Do not use this medication for longer than 2 weeks in a row Continue betamethasone up to twice a day to stubborn red, itchy areas below our face. So not use this medication for longer than 2 weeks in a row  Allergic  rhinitis Continue allergen avoidance measures directed toward dust mite and tree pollen as listed below Continue an antihistamine once a day as needed for runny nose or itch Consider saline nasal rinses as needed for nasal symptoms. Use this before any medicated nasal sprays for best result  Hives (urticaria) Begin prednisone 10 mg tablets. Take 2 tablets twice a day for 3 days, then take 2 tablets once a day for 1 day, then take 1 tablet on the 5th day, then stop  Use the least amount of medications while remaining hive and itch free Allegra 180 mg twice a day and famotidine (Pepcid) 20 mg twice a day. If no symptoms for 7-14 days then decrease to. Allegra 180 mg twice a day and famotidine (Pepcid) 20 mg once a day.  If no symptoms for 7-14 days then decrease to. Allegra 180 mg twice a day.  If no symptoms for 7-14 days then decrease to. Allegra 180 mg once a day.  May use Benadryl (diphenhydramine) as needed for breakthrough hives       If symptoms return, then step up dosage We have ordered some lab work to help [place] evaluate your hives. We will call you when the results become available Keep a detailed symptom journal including foods eaten, contact with allergens, medications taken, weather changes.  Prescription foreee.   Call the clinic if this treatment plan is not working well for you  Follow up in 1 month or sooner if needed.   Return in about 4 weeks (around 12/19/2021), or if symptoms worsen or fail to improve.    Thank you for the opportunity to care for this patient.  Please do not hesitate to contact me with questions.  Thermon Leyland, FNP [facility]

## 2021-11-20 NOTE — Patient Instructions (Incomplete)
Inflammatory dermatosis flare Continue a twice a day moisturizing routine Begin Opezlura up to twice a day as needed to red and itchy areas Continue hydrocortisone 2.5% cream up to twice a day as needed Continue triamcinolone 0.1% ointment to red and itchy areas underneath your face up to twice a day as needed.  Do not use this medication for longer than 2 weeks in a row Continue betamethasone up to twice a day to stubborn red, itchy areas below our face. So not use this medication for longer than 2 weeks in a row  Allergic rhinitis Continue allergen avoidance measures directed toward dust mite and tree pollen as listed below Continue an antihistamine once a day as needed for runny nose or itch Consider saline nasal rinses as needed for nasal symptoms. Use this before any medicated nasal sprays for best result  Hives (urticaria) Begin prednisone 10 mg tablets. Take 2 tablets twice a day for 3 days, then take 2 tablets once a day for 1 day, then take 1 tablet on the 5th day, then stop  Use the least amount of medications while remaining hive and itch free Allegra 180 mg twice a day and famotidine (Pepcid) 20 mg twice a day. If no symptoms for 7-14 days then decrease to. Allegra 180 mg twice a day and famotidine (Pepcid) 20 mg once a day.  If no symptoms for 7-14 days then decrease to. Allegra 180 mg twice a day.  If no symptoms for 7-14 days then decrease to. Allegra 180 mg once a day.  May use Benadryl (diphenhydramine) as needed for breakthrough hives       If symptoms return, then step up dosage We have ordered some lab work to help [place] evaluate your hives. We will call you when the results become available Keep a detailed symptom journal including foods eaten, contact with allergens, medications taken, weather changes.  Prescription foreee.   Call the clinic if this treatment plan is not working well for you  Follow up in 1 month or sooner if needed.  Skin care recommendations    Bath time: Always use lukewarm water. AVOID very hot or cold water. Keep bathing time to 5-10 minutes. Do NOT use bubble bath. Use a mild soap and use just enough to wash the dirty areas. Do NOT scrub skin vigorously.  After bathing, pat dry your skin with a towel. Do NOT rub or scrub the skin.   Moisturizers and prescriptions:  ALWAYS apply moisturizers immediately after bathing (within 3 minutes). This helps to lock-in moisture. Use the moisturizer several times a day over the whole body. Good summer moisturizers include: Aveeno, CeraVe, Cetaphil. Good winter moisturizers include: Aquaphor, Vaseline, Cerave, Cetaphil, Eucerin, Vanicream. When using moisturizers along with medications, the moisturizer should be applied about one hour after applying the medication to prevent diluting effect of the medication or moisturize around where you applied the medications. When not using medications, the moisturizer can be continued twice daily as maintenance.   Laundry and clothing: Avoid laundry products with added color or perfumes. Use unscented hypo-allergenic laundry products such as Tide free, Cheer free & gentle, and All free and clear.  If the skin still seems dry or sensitive, you can try double-rinsing the clothes. Avoid tight or scratchy clothing such as wool. Do not use fabric softeners or dyer sheets.

## 2021-11-21 ENCOUNTER — Encounter: Payer: Self-pay | Admitting: Family Medicine

## 2021-11-21 ENCOUNTER — Telehealth: Payer: Self-pay

## 2021-11-21 ENCOUNTER — Ambulatory Visit: Payer: BC Managed Care – PPO | Admitting: Family Medicine

## 2021-11-21 VITALS — BP 110/70 | HR 79 | Temp 98.0°F | Resp 16 | Ht 64.0 in | Wt 148.0 lb

## 2021-11-21 DIAGNOSIS — L253 Unspecified contact dermatitis due to other chemical products: Secondary | ICD-10-CM | POA: Diagnosis not present

## 2021-11-21 DIAGNOSIS — J3089 Other allergic rhinitis: Secondary | ICD-10-CM

## 2021-11-21 DIAGNOSIS — L508 Other urticaria: Secondary | ICD-10-CM | POA: Diagnosis not present

## 2021-11-21 DIAGNOSIS — J302 Other seasonal allergic rhinitis: Secondary | ICD-10-CM

## 2021-11-21 NOTE — Telephone Encounter (Signed)
Opzelura 1.5% cream PA....  KEY: [id number] created 11/21/21  PA has been sent to [facility] for review.

## 2021-11-22 NOTE — Telephone Encounter (Signed)
Pa was approved until 11/22/2022

## 2021-11-29 LAB — CMP14+EGFR
ALT: 21 IU/L (ref 0–32)
AST: 22 IU/L (ref 0–40)
Albumin/Globulin Ratio: 2 (ref 1.2–2.2)
Albumin: 5 g/dL — ABNORMAL HIGH (ref 3.9–4.9)
Alkaline Phosphatase: 53 IU/L (ref 44–121)
BUN/Creatinine Ratio: 17 (ref 9–23)
BUN: 13 mg/dL (ref 6–20)
Bilirubin Total: 0.5 mg/dL (ref 0.0–1.2)
CO2: 22 mmol/L (ref 20–29)
Calcium: 10 mg/dL (ref 8.7–10.2)
Chloride: 101 mmol/L (ref 96–106)
Creatinine, Ser: 0.77 mg/dL (ref 0.57–1.00)
Globulin, Total: 2.5 g/dL (ref 1.5–4.5)
Glucose: 90 mg/dL (ref 70–99)
Potassium: 4.5 mmol/L (ref 3.5–5.2)
Sodium: 137 mmol/L (ref 134–144)
Total Protein: 7.5 g/dL (ref 6.0–8.5)
eGFR: 101 mL/min/{1.73_m2} (ref >59)

## 2021-11-29 LAB — CBC WITH DIFFERENTIAL/PLATELET
Basophils Absolute: 0.1 10*3/uL (ref 0.0–0.2)
Basos: 2 %
EOS (ABSOLUTE): 0.1 10*3/uL (ref 0.0–0.4)
Eos: 3 %
Hematocrit: 37.4 % (ref 34.0–46.6)
Hemoglobin: 12.9 g/dL (ref 11.1–15.9)
Immature Grans (Abs): 0 10*3/uL (ref 0.0–0.1)
Immature Granulocytes: 0 %
Lymphocytes Absolute: 1.6 10*3/uL (ref 0.7–3.1)
Lymphs: 38 %
MCH: 31.3 pg (ref 26.6–33.0)
MCHC: 34.5 g/dL (ref 31.5–35.7)
MCV: 91 fL (ref 79–97)
Monocytes Absolute: 0.5 10*3/uL (ref 0.1–0.9)
Monocytes: 11 %
Neutrophils Absolute: 1.9 10*3/uL (ref 1.4–7.0)
Neutrophils: 46 %
Platelets: 309 10*3/uL (ref 150–450)
RBC: 4.12 x10E6/uL (ref 3.77–5.28)
RDW: 11.3 % — ABNORMAL LOW (ref 11.7–15.4)
WBC: 4.2 10*3/uL (ref 3.4–10.8)

## 2021-11-29 LAB — ALPHA-GAL PANEL
Allergen Lamb IgE: <0.1 kU/L
Beef IgE: <0.1 kU/L
IgE (Immunoglobulin E), Serum: 31 IU/mL (ref 6–495)
O215-IgE Alpha-Gal: 0.23 kU/L — AB
Pork IgE: <0.1 kU/L

## 2021-11-29 LAB — THYROID PEROXIDASE ANTIBODY: Thyroperoxidase Ab SerPl-aCnc: 9 IU/mL (ref 0–34)

## 2021-11-29 LAB — THYROGLOBULIN LEVEL: Thyroglobulin (TG-RIA): 12 ng/mL

## 2021-11-29 LAB — TRYPTASE: Tryptase: 3.6 ug/L (ref 2.2–13.2)

## 2021-11-29 LAB — CHRONIC URTICARIA: cu index: 1.3 (ref <10)

## 2021-11-29 NOTE — Progress Notes (Signed)
Can you please let this patient know that her lab test results are available. The alpha gal level was low/equivocal and the mammal meats beef, pork and lamb were negative. This is likely not significant, however, she can cut out mammalian meats for the next couple of weeks and note the rash. All other testing was unremarkable. Please ask her if she has any questions about Xolair for hive suppression. Thank you  CBC- normal. Tells [place] about cell activity CMP- Normal. Tells [place] about kidney/liver function. Alhpa gal- slightly elevated to alpha gal but negative to mammalian meat testing. Chronic urticaria panel- looks for an autoimmune cause of hives. Thyroid antibodies- normal. Can be a source of hives if abnormal. Thank you

## 2021-12-02 ENCOUNTER — Telehealth: Payer: Self-pay | Admitting: Family Medicine

## 2021-12-02 NOTE — Telephone Encounter (Signed)
LVM returning patient's call. Informed her that a mychart message was also sent with the lab results if she chose not to call back again.

## 2021-12-02 NOTE — Telephone Encounter (Signed)
Pt returned call for results.

## 2021-12-09 NOTE — Telephone Encounter (Signed)
Patient has reviewed MyChart message.

## 2021-12-11 MED ORDER — OPZELURA 1.5 % EX CREA: 1.0000 | TOPICAL_CREAM | Freq: Two times a day (BID) | CUTANEOUS | 5 refills | Status: AC | PRN

## 2021-12-11 NOTE — Addendum Note (Signed)
Addended by: Berniece Andreas L on: 12/11/2021 05:00 PM   Modules accepted: Orders

## 2021-12-11 NOTE — Telephone Encounter (Signed)
Patient is requesting prescription for opzelura to be sent in to [facility] [address]

## 2021-12-11 NOTE — Telephone Encounter (Signed)
Rx of opzelura sent to [address]

## 2021-12-18 ENCOUNTER — Ambulatory Visit: Payer: BC Managed Care – PPO | Admitting: Family Medicine

## 2022-10-27 IMAGING — MG DIGITAL DIAGNOSTIC BILAT W/ TOMO W/ CAD
6 of 12 series · 6 of 36 positions shown · non-contrast
Comparison: Previous exam(s).

CLINICAL DATA: Small mass felt by the patient in 1 of her breasts
for months ago. She has not felt the mass recently and cannot
remember which breast it was in. She is planning on having a
bilateral breast lift and possible implant placement. Family history
of breast cancer in maternal and paternal great grandmothers.

EXAM:
DIGITAL DIAGNOSTIC BILATERAL MAMMOGRAM WITH TOMOSYNTHESIS AND CAD
TECHNIQUE: Bilateral digital diagnostic mammography and breast tomosynthesis
was performed. The images were evaluated with computer-aided
detection.

[L XCCL synth-2D]
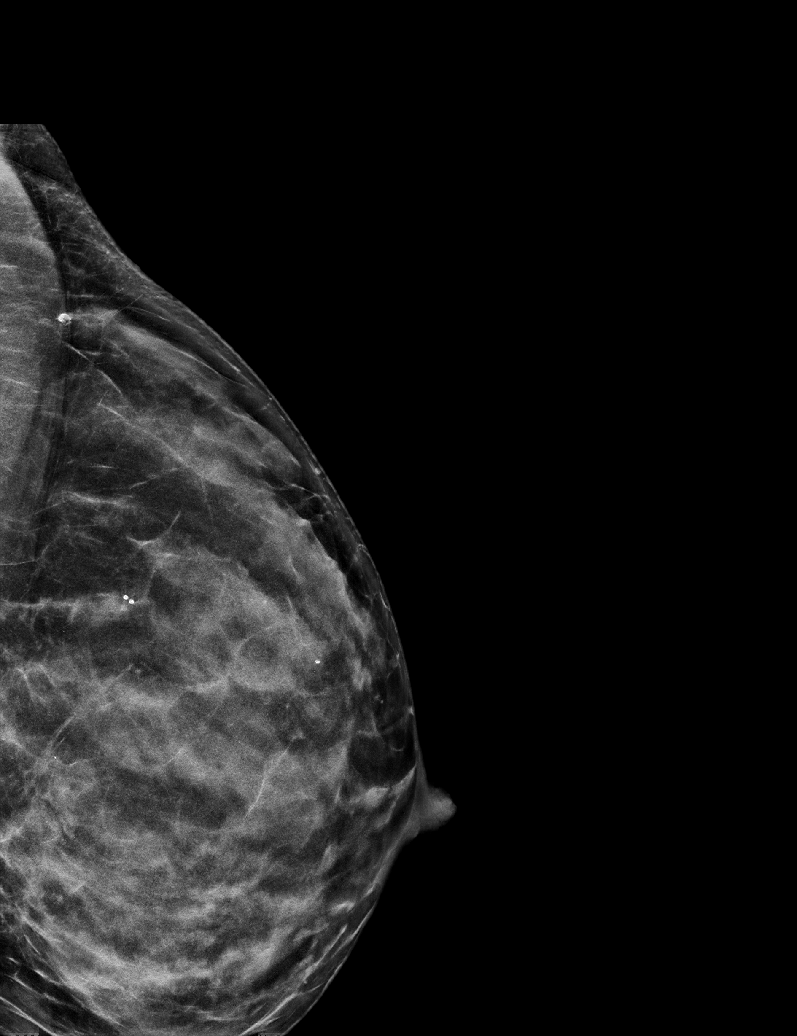

[L MLO synth-2D]
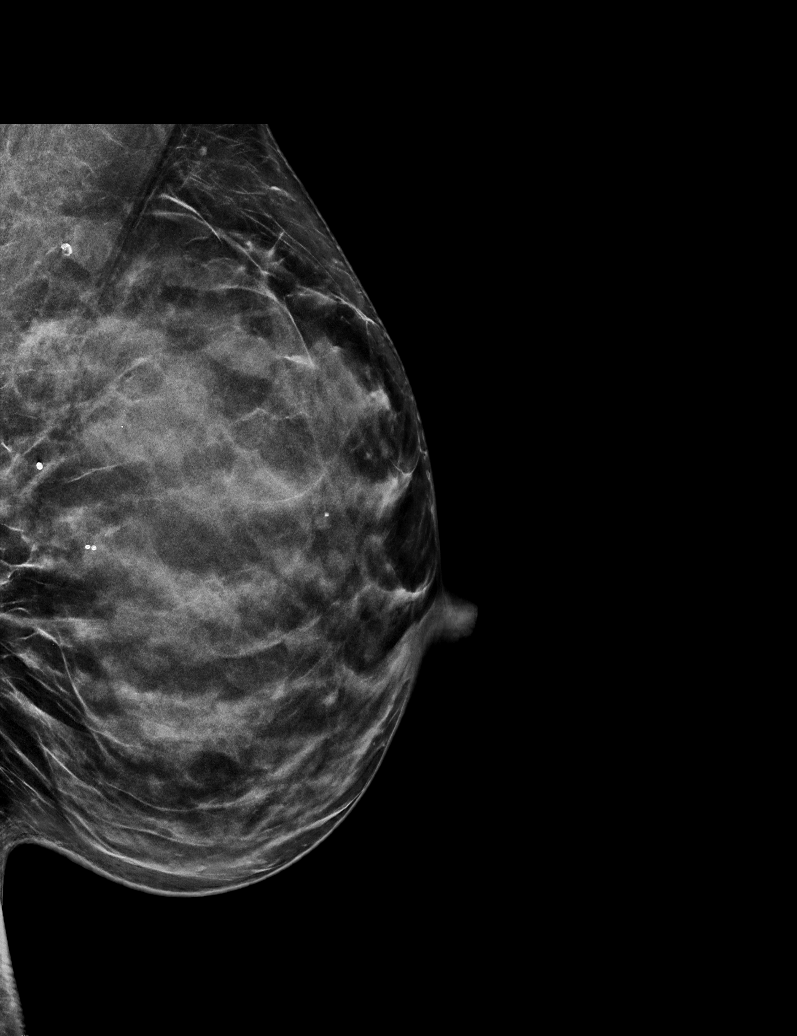

[R MLO synth-2D]
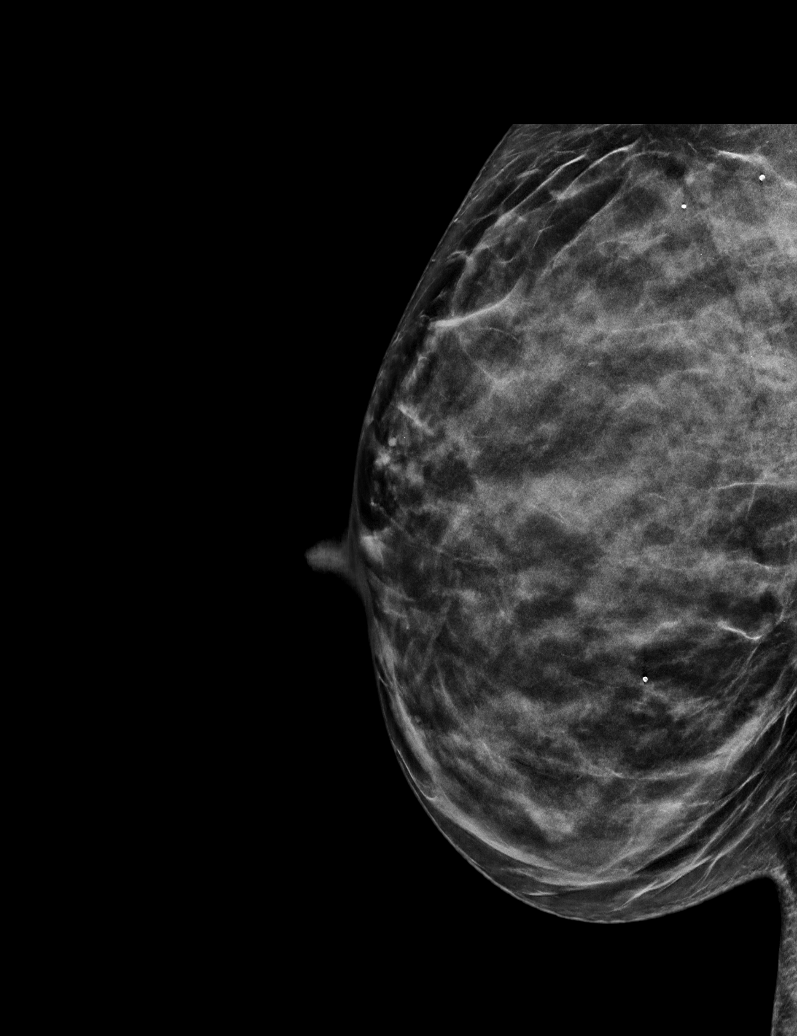

[R CC synth-2D]
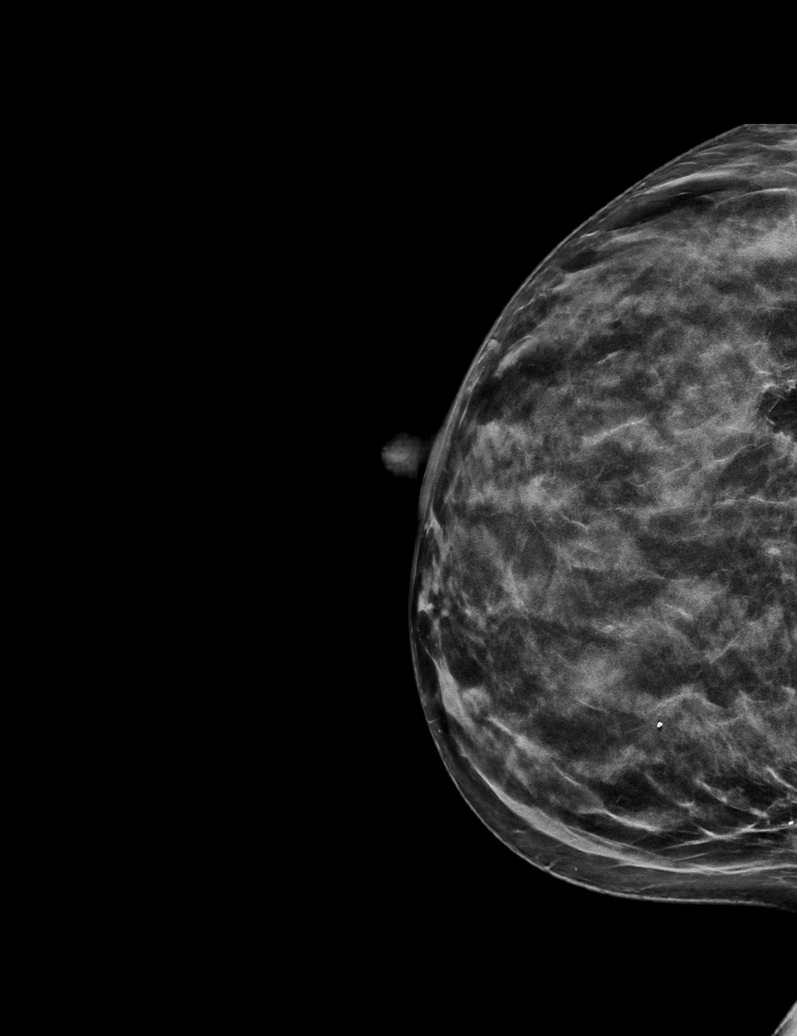

[L CC synth-2D]
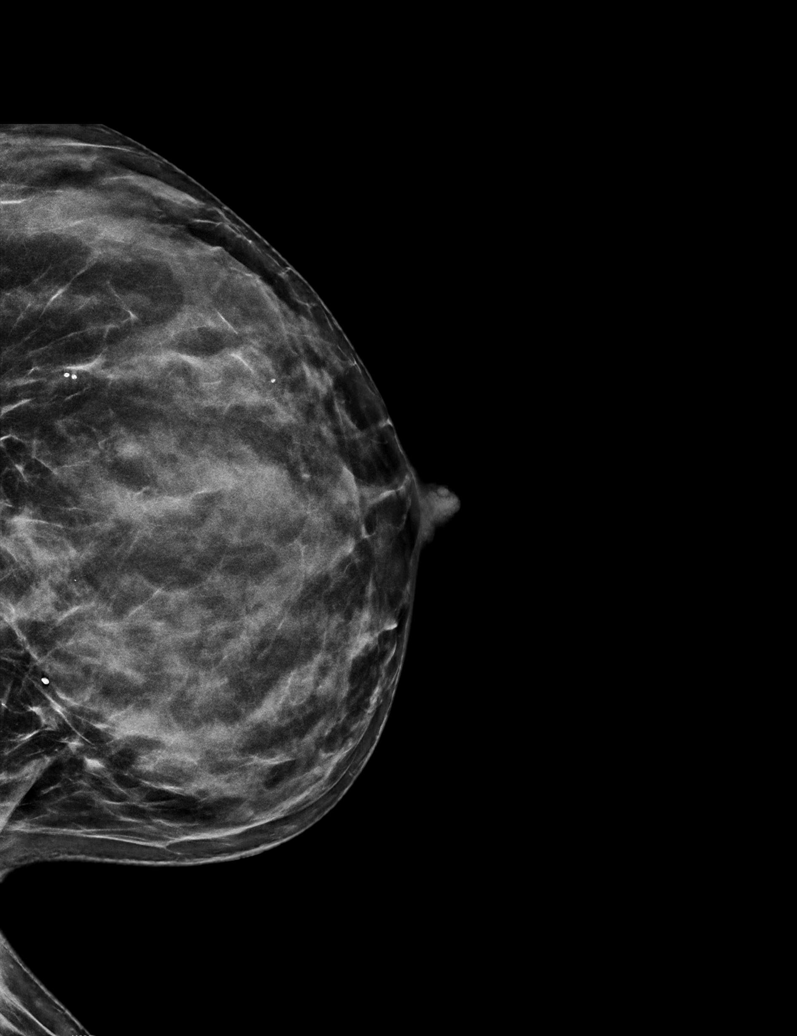

[R XCCL synth-2D]
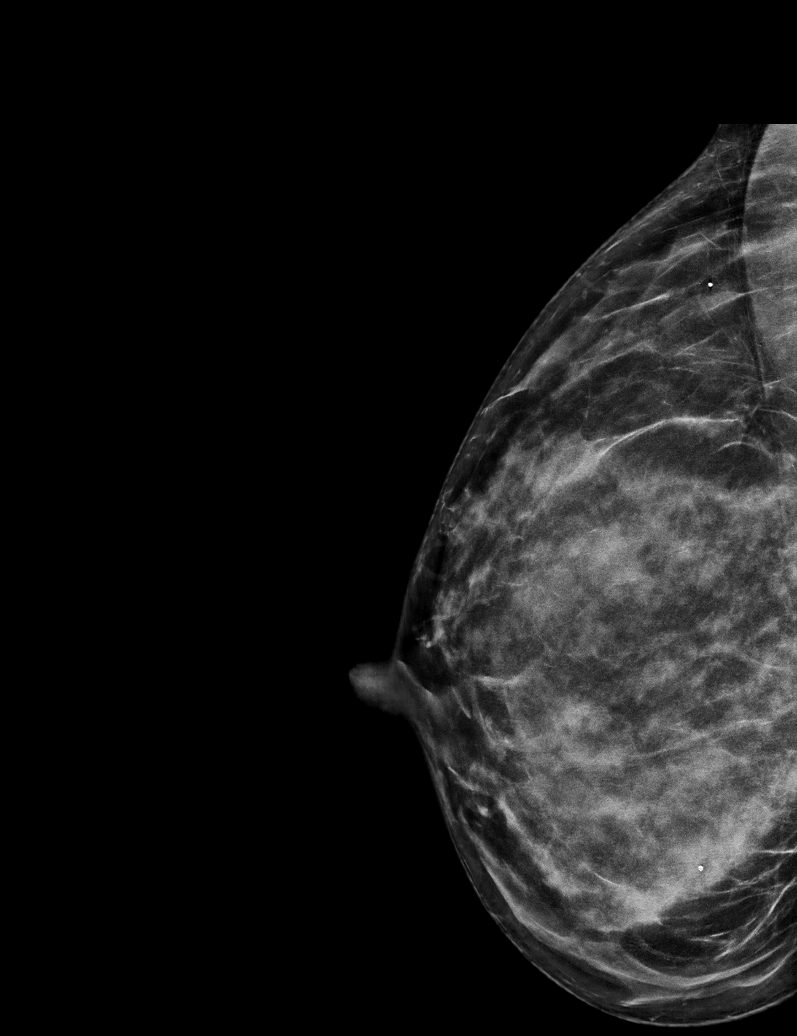

[6 of 36 positions shown; findings below may reference images not displayed]

ACR Breast Density Category d: The breast tissue is extremely dense,
which lowers the sensitivity of mammography.
FINDINGS: Stable mammographic appearance of the breasts with no findings for
malignancy in either breast.
IMPRESSION: No evidence of malignancy.

RECOMMENDATION:
Annual screening mammography beginning at age 40.

I have discussed the findings and recommendations with the patient.
If applicable, a reminder letter will be sent to the patient
regarding the next appointment.

BI-RADS CATEGORY  1: Negative.

## 2023-03-06 ENCOUNTER — Encounter: Payer: Self-pay | Admitting: Family Medicine

## 2023-03-06 ENCOUNTER — Ambulatory Visit (INDEPENDENT_AMBULATORY_CARE_PROVIDER_SITE_OTHER): Payer: BC Managed Care – PPO

## 2023-03-06 ENCOUNTER — Ambulatory Visit: Payer: BC Managed Care – PPO | Admitting: Family Medicine

## 2023-03-06 VITALS — BP 100/62 | HR 85 | Temp 97.8°F | Ht 64.0 in | Wt 146.0 lb

## 2023-03-06 DIAGNOSIS — M545 Low back pain, unspecified: Secondary | ICD-10-CM | POA: Insufficient documentation

## 2023-03-06 DIAGNOSIS — G8929 Other chronic pain: Secondary | ICD-10-CM

## 2023-03-06 DIAGNOSIS — E611 Iron deficiency: Secondary | ICD-10-CM | POA: Diagnosis not present

## 2023-03-06 DIAGNOSIS — O480 Post-term pregnancy: Secondary | ICD-10-CM

## 2023-03-06 DIAGNOSIS — E538 Deficiency of other specified B group vitamins: Secondary | ICD-10-CM | POA: Insufficient documentation

## 2023-03-06 DIAGNOSIS — O352XX0 Maternal care for (suspected) hereditary disease in fetus, not applicable or unspecified: Secondary | ICD-10-CM

## 2023-03-06 HISTORY — DX: Maternal care for (suspected) hereditary disease in fetus, not applicable or unspecified: O35.2XX0

## 2023-03-06 HISTORY — DX: Post-term pregnancy: O48.0

## 2023-03-06 LAB — COMPREHENSIVE METABOLIC PANEL
ALT: 35 U/L (ref 0–35)
AST: 31 U/L (ref 0–37)
Albumin: 4.8 g/dL (ref 3.5–5.2)
Alkaline Phosphatase: 41 U/L (ref 39–117)
BUN: 16 mg/dL (ref 6–23)
CO2: 28 meq/L (ref 19–32)
Calcium: 9.6 mg/dL (ref 8.4–10.5)
Chloride: 103 meq/L (ref 96–112)
Creatinine, Ser: 0.77 mg/dL (ref 0.40–1.20)
GFR: 97.05 mL/min (ref >60.00)
Glucose, Bld: 95 mg/dL (ref 70–99)
Potassium: 3.9 meq/L (ref 3.5–5.1)
Sodium: 138 meq/L (ref 135–145)
Total Bilirubin: 0.5 mg/dL (ref 0.2–1.2)
Total Protein: 7.6 g/dL (ref 6.0–8.3)

## 2023-03-06 LAB — VITAMIN B12: Vitamin B-12: 426 pg/mL (ref 211–911)

## 2023-03-06 LAB — CBC WITH DIFFERENTIAL/PLATELET
Basophils Absolute: 0.1 10*3/uL (ref 0.0–0.1)
Basophils Relative: 1 % (ref 0.0–3.0)
Eosinophils Absolute: 0.2 10*3/uL (ref 0.0–0.7)
Eosinophils Relative: 2.6 % (ref 0.0–5.0)
HCT: 37.5 % (ref 36.0–46.0)
Hemoglobin: 12.8 g/dL (ref 12.0–15.0)
Lymphocytes Relative: 29.9 % (ref 12.0–46.0)
Lymphs Abs: 2 10*3/uL (ref 0.7–4.0)
MCHC: 34.1 g/dL (ref 30.0–36.0)
MCV: 93.6 fL (ref 78.0–100.0)
Monocytes Absolute: 0.7 10*3/uL (ref 0.1–1.0)
Monocytes Relative: 10.6 % (ref 3.0–12.0)
Neutro Abs: 3.7 10*3/uL (ref 1.4–7.7)
Neutrophils Relative %: 55.9 % (ref 43.0–77.0)
Platelets: 301 10*3/uL (ref 150.0–400.0)
RBC: 4 Mil/uL (ref 3.87–5.11)
RDW: 12 % (ref 11.5–15.5)
WBC: 6.7 10*3/uL (ref 4.0–10.5)

## 2023-03-06 LAB — FOLATE: Folate: 19.3 ng/mL (ref >5.9)

## 2023-03-06 LAB — FERRITIN: Ferritin: 33.6 ng/mL (ref 10.0–291.0)

## 2023-03-06 NOTE — Progress Notes (Signed)
Subjective:     Patient ID: Sheila Salinas, female    DOB: 1986/02/15, 38 y.o.   MRN: [id number]  Chief Complaint  Patient presents with   Back Pain    x6 months to a year, pelvis up to middle area    Back Pain Pertinent negatives include no abdominal pain, chest pain, dysuria, fever, tingling or weight loss.     History of Present Illness         Here with mid to low back pain for several months- years. States her hips also hurt sometimes.  She thinks it may be related to her mattress. Pain has improved since she started sleeping on her couch over the 5-6 nights.   She got a new mattress today.   She has seen her chiropractor for this that helped but not in the past 3-4 months.   Having issues doing some of her exercises such as squats and dead lifts.   Hx of tight hip flexors.   Hx of vitamin B12 def  Hx of iron def  Scheduled with OB[facility]GYN- [facility] Maintenance Due  Topic Date Due   Hepatitis C Screening  Never done   Cervical Cancer Screening (HPV/Pap Cotest)  Never done   DTaP/Tdap/Td (2 - Td or Tdap) 09/28/2021    Past Medical History:  Diagnosis Date   Ectopic pregnancy, tubal 05/06/2011   Tx'd with MTX in [facility]U   Hereditary disease in family possibly affecting fetus 03/06/2023   sister w h/o hlhs/pulm atresia. ECHO scheduled. Normal fetal echo     Post-term pregnancy 03/06/2023    History reviewed. No pertinent surgical history.  Family History  Problem Relation Age of Onset   Alcohol abuse Mother    Depression Mother    Miscarriages / [place] Mother    Alcohol abuse Father    Cancer Father    Drug abuse Father    Early death Maternal Grandmother    High Cholesterol Maternal Grandmother    High blood pressure Maternal Grandmother    Stroke Maternal Grandmother     Social History   Socioeconomic History   Marital status: Married    Spouse name: Not on file   Number of children: Not on file   Years of education: Not  on file   Highest education level: Associate degree: occupational, Scientist, product/process development, or vocational program  Occupational History   Not on file  Tobacco Use   Smoking status: Former    Current packs/day: 0.00    Types: Cigarettes    Quit date: 06/18/2010    Years since quitting: 12.7   Smokeless tobacco: Never  Substance and Sexual Activity   Alcohol use: Yes    Comment: Weekly   Drug use: No   Sexual activity: Yes    Birth control/protection: None  Other Topics Concern   Not on file  Social History Narrative   Not on file   Social Drivers of Health   Financial Resource Strain: Low Risk  (03/02/2023)   Overall Financial Resource Strain (CARDIA)    Difficulty of Paying Living Expenses: Not hard at all  Food Insecurity: No Food Insecurity (03/02/2023)   Hunger Vital Sign    Worried About Running Out of Food in the Last Year: Never true    Ran Out of Food in the Last Year: Never true  Transportation Needs: No Transportation Needs (03/02/2023)   PRAPARE - Administrator, Civil Service (Medical): No  Lack of Transportation (Non-Medical): No  Physical Activity: Sufficiently Active (03/02/2023)   Exercise Vital Sign    Days of Exercise per Week: 3 days    Minutes of Exercise per Session: 60 min  Stress: Stress Concern Present (03/02/2023)   [employer] of Occupational Health - Occupational Stress Questionnaire    Feeling of Stress : To some extent  Social Connections: Socially Integrated (03/02/2023)   Social Connection and Isolation Panel [NHANES]    Frequency of Communication with Friends and Family: More than three times a week    Frequency of Social Gatherings with Friends and Family: More than three times a week    Attends Religious Services: More than 4 times per year    Active Member of [employer] or Organizations: Yes    Attends Engineer, structural: More than 4 times per year    Marital Status: Married  Catering manager Violence: Not on file    Outpatient  Medications Prior to Visit  Medication Sig Dispense Refill   hydrocortisone 2.5 % ointment Apply topically 2 (two) times daily. 30 g 0   Ruxolitinib Phosphate (OPZELURA) 1.5 % CREA Apply 1 Application topically 2 (two) times daily as needed. 60 g 5   EPINEPHrine 0.3 mg/0.3 mL IJ SOAJ injection SMARTSIG:0.3 Milliliter(s) IM Once PRN (Patient not taking: Reported on 03/06/2023)     No facility-administered medications prior to visit.    Allergies  Allergen Reactions   Morphine Nausea And Vomiting   Other     Review of Systems  Constitutional:  Negative for chills, fever and weight loss.  Respiratory:  Negative for shortness of breath.   Cardiovascular:  Negative for chest pain, palpitations and leg swelling.  Gastrointestinal:  Negative for abdominal pain, constipation, diarrhea, nausea and vomiting.  Genitourinary:  Negative for dysuria, frequency and urgency.  Musculoskeletal:  Positive for back pain. Negative for falls.  Neurological:  Negative for dizziness, tingling and focal weakness.       Objective:    Physical Exam Constitutional:      General: She is not in acute distress.    Appearance: She is not ill-appearing.  Eyes:     Extraocular Movements: Extraocular movements intact.     Conjunctiva/sclera: Conjunctivae normal.  Cardiovascular:     Rate and Rhythm: Normal rate and regular rhythm.  Pulmonary:     Effort: Pulmonary effort is normal.     Breath sounds: Normal breath sounds.  Musculoskeletal:     Cervical back: Normal range of motion and neck supple.     Right lower leg: No edema.     Left lower leg: No edema.     Comments: Good ROM of back. Non tender.   Skin:    General: Skin is warm and dry.  Neurological:     General: No focal deficit present.     Mental Status: She is alert and oriented to person, place, and time.     Sensory: No sensory deficit.     Motor: No weakness.     Coordination: Coordination normal.     Gait: Gait normal.  Psychiatric:         Mood and Affect: Mood normal.        Behavior: Behavior normal.        Thought Content: Thought content normal.      BP 100/62 (BP Location: Left Arm, Patient Position: Sitting, Cuff Size: Normal)   Pulse 85   Temp 97.8 F (36.6 C) (Temporal)   Ht 5'  4" (1.626 m)   Wt 146 lb (66.2 kg)   SpO2 99%   Breastfeeding No   BMI 25.06 kg/m  Wt Readings from Last 3 Encounters:  03/06/23 146 lb (66.2 kg)  11/21/21 148 lb (67.1 kg)  10/22/21 145 lb 3.2 oz (65.9 kg)       Assessment & Plan:   Problem List Items Addressed This Visit     Acute on chronic low back pain - Primary   Relevant Orders   DG Lumbar Spine Complete   Iron deficiency   Relevant Orders   CBC with Differential/Platelet   Ferritin   Folate   Vitamin B12   Comprehensive metabolic panel   Vitamin B 12 deficiency   Relevant Orders   Vitamin B12   Comprehensive metabolic panel   She is not in pain today.  In her usual state of health. Back pain has been improving since sleeping on her couch in a different mattress.  Fortunately she has a new mattress being delivered today.  She will keep me posted on her pain.  We discussed stretching before exercise.  Back exercise handout provided.  X-ray ordered. Check labs due to history of iron and B12 deficiency.  I am having Aiden N. Gammon maintain her EPINEPHrine, hydrocortisone, and Opzelura.  No orders of the defined types were placed in this encounter.

## 2023-03-06 NOTE — Patient Instructions (Signed)
Please go downstairs for labs and X ray.   We will be in touch with your results.   Let me know if your pain is not improving.

## 2023-03-09 NOTE — Telephone Encounter (Signed)
Please advise on exercises, care or supplements

## 2023-03-11 ENCOUNTER — Ambulatory Visit: Payer: BC Managed Care – PPO | Admitting: Family Medicine

## 2023-07-27 ENCOUNTER — Other Ambulatory Visit: Payer: Self-pay

## 2023-07-27 ENCOUNTER — Ambulatory Visit
Admission: RE | Admit: 2023-07-27 | Discharge: 2023-07-27 | Disposition: A | Discharge status: Home / Self Care | Source: Ambulatory Visit | Attending: Physician Assistant | Admitting: Physician Assistant

## 2023-07-27 VITALS — BP 112/72 | HR 78 | Temp 98.2°F | Resp 18 | Ht 64.0 in | Wt 136.0 lb

## 2023-07-27 DIAGNOSIS — S61212A Laceration without foreign body of right middle finger without damage to nail, initial encounter: Secondary | ICD-10-CM

## 2023-07-27 NOTE — Discharge Instructions (Addendum)
 You were seen today for concerns of a laceration to your right middle finger We have repaired this with Steri-strips and provided a splint to help with recovery The steri-strips should stay on for about 5-7 days. If you notice the edges pulling or lifting up you can trim these with clean scissors  You should not need to return to the clinic for removal of the strips as they come off on their own Please keep the area clean with warm water and gentle soap. You do not need to scrub the area or use harsh cleansers on it as this can cause further irritation.  If you start to notice any of the following please return to Urgent Care or go to the ED: swelling and redness, difficulty bending or straightening the finger, profuse bleeding, drainage that looks like pus coming from the wound.

## 2023-07-27 NOTE — ED Triage Notes (Signed)
 Pt presents with complaints of right middle finger laceration that occurred last night at approximately 10 PM. Pt states she was putting away a dermaplaning blade when she accidentally cut her finger. Pt states her pain is a 10/10 when touching/moving the finger. Two bandages applied, bled through those. Bleeding is currently controlled. Third bandage applied PTA.

## 2023-07-27 NOTE — ED Provider Notes (Signed)
 Sheila Salinas    CSN: [id number] Arrival date & time: 07/27/23  0954      History   Chief Complaint Chief Complaint  Patient presents with   Laceration    Entered by patient    HPI Sheila Salinas is a 38 y.o. female.   HPI  Pt reports she sustained a laceration to her right middle finger last night at around 10 pm  She reports this was from a dermaplane blade that she was using to dermablade her face Most recent tdap booster was sometime ago- she declines booster today  She is right handed  She reports pain with bending the finger and pain with palpation     Past Medical History:  Diagnosis Date   Ectopic pregnancy, tubal 05/06/2011   Tx'd with MTX in [facility]   Hereditary disease in family possibly affecting fetus 03/06/2023   sister w h/o hlhs/pulm atresia. ECHO scheduled. 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 No pertinent surgical history.  OB History     Gravida  3   Para  2   Term  2   Preterm  0   AB  1   Living  2      SAB  0   IAB  0   Ectopic  1   Multiple  0   Live Births  2            Home Medications    Prior to Admission medications   Medication Sig Start Date End Date Taking? Authorizing  Provider  EPINEPHrine  0.3 mg/0.3 mL IJ SOAJ injection SMARTSIG:0.3 Milliliter(s) IM Once PRN Patient not taking: Reported on 03/06/2023 08/29/21   [provider]  hydrocortisone  2.5 % ointment Apply topically 2 (two) times daily. 09/09/21   Ardie Kras, FNP  Ruxolitinib Phosphate  (OPZELURA ) 1.5 % CREA Apply 1 Application topically 2 (two) times daily as needed. 12/11/21   Ambs, Jeanmarie Millet, FNP    Family History Family History  Problem Relation Age of Onset   Alcohol abuse Mother    Depression Mother    Miscarriages / [place] Mother    Alcohol abuse Father    Cancer Father    Drug abuse Father    Early death Maternal Grandmother    High Cholesterol Maternal Grandmother    High blood pressure Maternal Grandmother    Stroke Maternal Grandmother     Social History Social History   Tobacco Use   Smoking status: Former    Current packs/day: 0.00    Types: Cigarettes    Quit date: 06/18/2010    Years since quitting: 13.1   Smokeless tobacco: Never  Vaping Use   Vaping status: Never Used  Substance Use Topics   Alcohol use: Yes  Comment: Weekly   Drug use: No     Allergies   Morphine   Review of Systems Review of Systems  Skin:  Positive for wound.     Physical Exam Triage Vital Signs ED Triage Vitals  Encounter Vitals Group     BP 07/27/23 1011 112/72     Systolic BP Percentile --      Diastolic BP Percentile --      Pulse Rate 07/27/23 1011 78     Resp 07/27/23 1011 18     Temp 07/27/23 1011 98.2 F (36.8 C)     Temp Source 07/27/23 1011 Oral     SpO2 07/27/23 1011 96 %     Weight 07/27/23 1009 136 lb (61.7 kg)     Height 07/27/23 1009 5\' 4"  (1.626 m)     Head Circumference --      Peak Flow --      Pain Score 07/27/23 1008 10     Pain Loc --      Pain Education --      Exclude from Growth Chart --    No data found.  Updated Vital Signs BP 112/72 (BP Location: Right Arm)   Pulse 78   Temp 98.2 F (36.8 C) (Oral)   Resp 18   Ht 5\' 4"   (1.626 m)   Wt 136 lb (61.7 kg)   LMP 07/22/2023 (Exact Date)   SpO2 96%   BMI 23.34 kg/m   Visual Acuity Right Eye Distance:   Left Eye Distance:   Bilateral Distance:    Right Eye Near:   Left Eye Near:    Bilateral Near:     Physical Exam Vitals reviewed.  Constitutional:      Appearance: Normal appearance.  HENT:     Head: Normocephalic and atraumatic.  Skin:    General: Skin is warm and dry.     Findings: Laceration present.          Comments: Laceration along right middle finger PIP joint. Patient is able to flex and extend fingers without decreased ROM    Neurological:     Mental Status: She is alert.  Psychiatric:        Mood and Affect: Mood normal.        Behavior: Behavior normal.      [facility] Treatments / Results  Labs (all labs ordered are listed, but only abnormal results are displayed) Labs Reviewed - No data to display  EKG   Radiology No results found.  Procedures Laceration Repair  Date/Time: 07/27/2023 2:00 PM  Performed by: Jerona Mooring, PA-C Authorized by: Jerona Mooring, PA-C   Consent:    Consent obtained:  Verbal   Consent given by:  Patient   Risks, benefits, and alternatives were discussed: yes     Risks discussed:  Infection, pain, poor wound healing and poor cosmetic result   Alternatives discussed:  No treatment Universal protocol:    Procedure explained and questions answered to patient or proxy's satisfaction: yes     Patient identity confirmed:  Verbally with patient Anesthesia:    Anesthesia method:  None Laceration details:    Location:  Finger   Finger location:  R long finger   Length (cm):  2   Depth (mm):  2 Exploration:    Limited defect created (wound extended): no   Treatment:    Area cleansed with:  Chlorhexidine   Amount of cleaning:  Standard   Irrigation solution:  Tap water  Irrigation method:  Tap   Debridement:  None   Undermining:  None Skin repair:    Repair method:  Steri-Strips   Number of  Steri-Strips:  4 Approximation:    Approximation:  Close Repair type:    Repair type:  Simple Post-procedure details:    Dressing:  Antibiotic ointment and splint for protection   Procedure completion:  Tolerated  (including critical care time)  Medications Ordered in [facility] Medications - No data to display  Initial Impression / Assessment and Plan / [facility] Course  I have reviewed the triage vital signs and the nursing notes.  Pertinent labs & imaging results that were available during my care of the patient were reviewed by me and considered in my medical decision making (see chart for details).      Final Clinical Impressions(s) / [facility] Diagnoses   Final diagnoses:  Laceration of right middle finger without foreign body without damage to nail, initial encounter   Pt presents today for concerns of a laceration to the right middle finger that occurred last night at about 10 Pm  She reports soreness and pain with flexion but this is not reduced or decreased from normal Physical exam is notable for approx 2 cm long laceration along right middle finger PIP joint. She appears to be neurovascularly intact. Area was cleansed with warm water and hibiclens mixture and then 4 steristrips were applied to assist with closure  Home care measures reviewed and provided in AVS. ED and return precautions reviewed and provided in AVS. Follow up as needed.      Discharge Instructions      You were seen today for concerns of a laceration to your right middle finger We have repaired this with Steri-strips and provided a splint to help with recovery The steri-strips should stay on for about 5-7 days. If you notice the edges pulling or lifting up you can trim these with clean scissors  You should not need to return to the clinic for removal of the strips as they come off on their own Please keep the area clean with warm water and gentle soap. You do not need to scrub the area or use harsh cleansers on it as  this can cause further irritation.  If you start to notice any of the following please return to Urgent Care or go to the ED: swelling and redness, difficulty bending or straightening the finger, profuse bleeding, drainage that looks like pus coming from the wound.     ED Prescriptions   None    PDMP not reviewed this encounter.   Nai Borromeo, Pearla Bottom, PA-C 07/27/23 1402

## 2024-03-04 ENCOUNTER — Ambulatory Visit: Admitting: Family Medicine

## 2024-03-04 ENCOUNTER — Ambulatory Visit: Payer: Self-pay | Admitting: Family Medicine

## 2024-03-04 ENCOUNTER — Encounter: Payer: Self-pay | Admitting: Family Medicine

## 2024-03-04 VITALS — BP 106/64 | HR 79 | Temp 97.8°F | Ht 64.0 in | Wt 145.0 lb

## 2024-03-04 DIAGNOSIS — J302 Other seasonal allergic rhinitis: Secondary | ICD-10-CM

## 2024-03-04 DIAGNOSIS — E538 Deficiency of other specified B group vitamins: Secondary | ICD-10-CM | POA: Diagnosis not present

## 2024-03-04 DIAGNOSIS — E611 Iron deficiency: Secondary | ICD-10-CM

## 2024-03-04 DIAGNOSIS — L659 Nonscarring hair loss, unspecified: Secondary | ICD-10-CM

## 2024-03-04 DIAGNOSIS — R490 Dysphonia: Secondary | ICD-10-CM

## 2024-03-04 DIAGNOSIS — Z77098 Contact with and (suspected) exposure to other hazardous, chiefly nonmedicinal, chemicals: Secondary | ICD-10-CM

## 2024-03-04 DIAGNOSIS — J3089 Other allergic rhinitis: Secondary | ICD-10-CM

## 2024-03-04 LAB — CBC WITH DIFFERENTIAL/PLATELET
Basophils Absolute: 0 K/uL (ref 0.0–0.1)
Basophils Relative: 0.7 % (ref 0.0–3.0)
Eosinophils Absolute: 0.1 K/uL (ref 0.0–0.7)
Eosinophils Relative: 1.7 % (ref 0.0–5.0)
HCT: 36.3 % (ref 36.0–46.0)
Hemoglobin: 12.4 g/dL (ref 12.0–15.0)
Lymphocytes Relative: 30 % (ref 12.0–46.0)
Lymphs Abs: 2 K/uL (ref 0.7–4.0)
MCHC: 34.2 g/dL (ref 30.0–36.0)
MCV: 95.1 fl (ref 78.0–100.0)
Monocytes Absolute: 0.6 K/uL (ref 0.1–1.0)
Monocytes Relative: 8.5 % (ref 3.0–12.0)
Neutro Abs: 4 K/uL (ref 1.4–7.7)
Neutrophils Relative %: 59.1 % (ref 43.0–77.0)
Platelets: 300 K/uL (ref 150.0–400.0)
RBC: 3.82 Mil/uL — ABNORMAL LOW (ref 3.87–5.11)
RDW: 12.5 % (ref 11.5–15.5)
WBC: 6.8 K/uL (ref 4.0–10.5)

## 2024-03-04 LAB — T4, FREE: Free T4: 0.84 ng/dL (ref 0.60–1.60)

## 2024-03-04 LAB — FOLATE: Folate: 16.9 ng/mL

## 2024-03-04 LAB — COMPREHENSIVE METABOLIC PANEL WITH GFR
ALT: 16 U/L (ref 3–35)
AST: 21 U/L (ref 5–37)
Albumin: 4.5 g/dL (ref 3.5–5.2)
Alkaline Phosphatase: 40 U/L (ref 39–117)
BUN: 13 mg/dL (ref 6–23)
CO2: 28 meq/L (ref 19–32)
Calcium: 9.3 mg/dL (ref 8.4–10.5)
Chloride: 102 meq/L (ref 96–112)
Creatinine, Ser: 0.73 mg/dL (ref 0.40–1.20)
GFR: 102.74 mL/min
Glucose, Bld: 86 mg/dL (ref 70–99)
Potassium: 3.7 meq/L (ref 3.5–5.1)
Sodium: 137 meq/L (ref 135–145)
Total Bilirubin: 0.6 mg/dL (ref 0.2–1.2)
Total Protein: 7.3 g/dL (ref 6.0–8.3)

## 2024-03-04 LAB — FERRITIN: Ferritin: 29.4 ng/mL (ref 10.0–291.0)

## 2024-03-04 LAB — VITAMIN B12: Vitamin B-12: 189 pg/mL — ABNORMAL LOW (ref 211–911)

## 2024-03-04 LAB — TSH: TSH: 1.11 u[IU]/mL (ref 0.35–5.50)

## 2024-03-04 LAB — VITAMIN D 25 HYDROXY (VIT D DEFICIENCY, FRACTURES): VITD: 29.73 ng/mL — ABNORMAL LOW (ref 30.00–100.00)

## 2024-03-04 MED ORDER — LEVOCETIRIZINE DIHYDROCHLORIDE 5 MG PO TABS: 5.0000 mg | ORAL_TABLET | Freq: Every evening | ORAL | 2 refills | Status: AC

## 2024-03-04 NOTE — Progress Notes (Signed)
 Her vitamin B 12 is low. I recommend weekly B12 injections in our office and then monthly going forward. Low vitamin D  also. I recommend taking vitamin D  over the counter 1,000 IUs daily. Her iron is low normal. All of these findings can contribute to hair thinning and fatigue.

## 2024-03-04 NOTE — Patient Instructions (Signed)
 Try taking daily allergy medication.   I am referring you to an ENT specialist.   I will be in touch with your results.

## 2024-03-04 NOTE — Progress Notes (Signed)
 "  Subjective:     Patient ID: Sheila Salinas, female    DOB: January 23, 1986, 39 y.o.   MRN: [id number]  Chief Complaint  Patient presents with   Acute Visit    Hoarse voice since fall  Mood swings and hair loss, wants hormones checked    HPI  Discussed the use of AI scribe software for clinical note transcription with the patient, who gave verbal consent to proceed.  History of Present Illness Sheila Salinas is a 39 year old female who presents with concerns regarding voice changes, allergies and hair thinning.   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  Diffuse hair thinning - Progressive diffuse hair thinning over approximately two years - Current hair volume estimated at one third of prior fullness - No clumps of hair loss - No use of chemicals on hair - History of low B12, vitamin D , and iron; recently started prenatal vitamin supplementation  - No significant fatigue; maintains good overall energy despite busy schedule  Gastrointestinal symptoms - No acid reflux except for a brief episode during first pregnancy     Health Maintenance Due  Topic Date Due   Hepatitis C Screening  Never done   Hepatitis B Vaccines 19-59 Average Risk (1 of 3 - 19+ 3-dose series) Never done   Cervical Cancer Screening (HPV/Pap Cotest)  Never done   DTaP/Tdap/Td (2 - Td or Tdap) 09/28/2021   Mammogram  07/30/2023    Past Medical History:  Diagnosis Date   Ectopic pregnancy, tubal 05/06/2011   Tx'd with MTX in [facility]   Hereditary disease in family possibly affecting fetus 03/06/2023   sister w h/o hlhs/pulm atresia. ECHO scheduled. Normal fetal echo     Post-term pregnancy 03/06/2023     History reviewed. No pertinent surgical history.  Family History  Problem Relation Age of Onset   Alcohol abuse Mother    Depression Mother    Miscarriages / Stillbirths Mother    Alcohol abuse Father    Cancer Father    Drug abuse Father    Early death Maternal Grandmother    High Cholesterol Maternal Grandmother    High blood pressure Maternal Grandmother    Stroke Maternal Grandmother     Social History   Socioeconomic History   Marital status: Married    Spouse name: Not on file   Number of children: Not on file   Years of education: Not on file   Highest education level: Associate degree: occupational, scientist, product/process development, or vocational program  Occupational History   Not on file  Tobacco Use   Smoking status: Former    Current packs/day: 0.00    Average packs/day: 1.0 packs/day    Types: Cigarettes    Quit date: 06/18/2010    Years since quitting: 13.7   Smokeless tobacco: Never  Vaping Use   Vaping status: Never Used  Substance and Sexual Activity   Alcohol use: Yes    Comment: Weekly   Drug use: No   Sexual activity: Yes    Birth control/protection: None  Other Topics Concern   Not on file  Social History Narrative   Not on file   Social Drivers of Health   Tobacco Use: Medium Risk (03/04/2024)  Patient History    Smoking Tobacco Use: Former    Smokeless Tobacco Use: Never    Passive Exposure: Not on file  Financial Resource Strain: Low Risk (03/02/2023)   Overall Financial Resource Strain (CARDIA)    Difficulty of Paying Living Expenses: Not hard at all  Food Insecurity: No Food Insecurity (03/02/2023)   Hunger Vital Sign    Worried About Running Out of Food in the Last Year: Never true    Ran Out of Food in the Last Year: Never true  Transportation Needs: No Transportation Needs (03/02/2023)   PRAPARE - Administrator, Civil Service (Medical): No    Lack of Transportation (Non-Medical): No  Physical Activity: Sufficiently Active (03/02/2023)    Exercise Vital Sign    Days of Exercise per Week: 3 days    Minutes of Exercise per Session: 60 min  Stress: Stress Concern Present (03/02/2023)   [employer] of Occupational Health - Occupational Stress Questionnaire    Feeling of Stress : To some extent  Social Connections: Socially Integrated (03/02/2023)   Social Connection and Isolation Panel    Frequency of Communication with Friends and Family: More than three times a week    Frequency of Social Gatherings with Friends and Family: More than three times a week    Attends Religious Services: More than 4 times per year    Active Member of Clubs or Organizations: Yes    Attends Banker Meetings: More than 4 times per year    Marital Status: Married  Catering Manager Violence: Not on file  Depression (PHQ2-9): Low Risk (03/04/2024)   Depression (PHQ2-9)    PHQ-2 Score: 0  Alcohol Screen: Low Risk (03/02/2023)   Alcohol Screen    Last Alcohol Screening Score (AUDIT): 2  Housing: Low Risk (03/02/2023)   Housing Stability Vital Sign    Unable to Pay for Housing in the Last Year: No    Number of Times Moved in the Last Year: 0    Homeless in the Last Year: No  Utilities: Not on file  Health Literacy: Not on file    Outpatient Medications Prior to Visit  Medication Sig Dispense Refill   hydrocortisone  2.5 % ointment Apply topically 2 (two) times daily. 30 g 0   Ruxolitinib Phosphate  (OPZELURA ) 1.5 % CREA Apply 1 Application topically 2 (two) times daily as needed. 60 g 5   EPINEPHrine  0.3 mg/0.3 mL IJ SOAJ injection SMARTSIG:0.3 Milliliter(s) IM Once PRN (Patient not taking: Reported on 03/04/2024)     No facility-administered medications prior to visit.    Allergies[1]  Review of Systems  Constitutional:  Negative for chills, fever and malaise/fatigue.  HENT:  Negative for congestion, sinus pain and sore throat.        Post nasal drainage and hoarseness     Respiratory:  Negative for shortness of breath.    Cardiovascular:  Negative for chest pain, palpitations and leg swelling.  Gastrointestinal:  Negative for abdominal pain, constipation, diarrhea, nausea and vomiting.  Genitourinary:  Negative for dysuria, frequency and urgency.  Neurological:  Negative for dizziness and focal weakness.       Objective:    Physical Exam Constitutional:      General: She is not in acute distress.    Appearance: She is not ill-appearing.  HENT:     Mouth/Throat:     Comments: Throat clearing during visit  Eyes:     Extraocular Movements: Extraocular movements intact.  Conjunctiva/sclera: Conjunctivae normal.  Cardiovascular:     Rate and Rhythm: Normal rate.  Pulmonary:     Effort: Pulmonary effort is normal.  Musculoskeletal:     Cervical back: Normal range of motion and neck supple.  Skin:    General: Skin is warm and dry.  Neurological:     General: No focal deficit present.     Mental Status: She is alert and oriented to person, place, and time.     Motor: No weakness.     Coordination: Coordination normal.     Gait: Gait normal.  Psychiatric:        Mood and Affect: Mood normal.        Behavior: Behavior normal.        Thought Content: Thought content normal.      BP 106/64   Pulse 79   Temp 97.8 F (36.6 C) (Temporal)   Ht 5' 4 (1.626 m)   Wt 145 lb (65.8 kg)   SpO2 100%   BMI 24.89 kg/m  Wt Readings from Last 3 Encounters:  03/04/24 145 lb (65.8 kg)  07/27/23 136 lb (61.7 kg)  03/06/23 146 lb (66.2 kg)       Assessment & Plan:   Problem List Items Addressed This Visit     Iron deficiency   Relevant Orders   CBC with Differential/Platelet   Comprehensive metabolic panel with GFR   Ferritin   Folate   Seasonal and perennial allergic rhinitis   Vitamin B 12 deficiency   Relevant Orders   Vitamin B12   Other Visit Diagnoses       Hoarseness, persistent    -  Primary   Relevant Orders   Ambulatory referral to ENT     Hair thinning       Relevant  Orders   CBC with Differential/Platelet   Comprehensive metabolic panel with GFR   Ferritin   Folate   TSH   T4, free   VITAMIN D  25 Hydroxy (Vit-D Deficiency, Fractures)   Zinc     Occupational exposure to chemicals       Relevant Orders   Ambulatory referral to ENT       Assessment and Plan Assessment & Plan Persistent hoarseness Since October, likely related to allergic rhinitis. Differential includes vocal cord issues. No prior ENT evaluation. - Prescribed Xyzal  for allergy management. -  ENT referral.  Allergic rhinitis Symptoms include persistent hoarseness and mucus production, likely due to allergies. No prior use of allergy medication. - Prescribed Xyzal  for allergy management.  Hair thinning under evaluation Gradual hair thinning over the past two years. Possible contributing factors include low B12, low D, and low iron levels. No recent changes in hair care routine. - Ordered blood tests to check B12, D, iron, and zinc levels.  Iron deficiency Previous low iron levels. Currently taking prenatal vitamins. - Ordered blood test to check iron levels.  Vitamin B12 deficiency Previous low B12 levels. Currently taking prenatal vitamins. - Ordered blood test to check B12 levels.  General health maintenance Up to date on mammograms. Energy levels are good. - Continue routine health maintenance.     I am having Auriella N. Elsen start on levocetirizine. I am also having her maintain her EPINEPHrine , hydrocortisone , and Opzelura .  Meds ordered this encounter  Medications   levocetirizine (XYZAL ) 5 MG tablet    Sig: Take 1 tablet (5 mg total) by mouth every evening.    Dispense:  30 tablet  Refill:  2    Supervising Provider:   ROLLENE NORRIS A [[id number]]       [1]  Allergies Allergen Reactions   Morphine Nausea And Vomiting   "

## 2024-03-07 ENCOUNTER — Ambulatory Visit

## 2024-03-07 DIAGNOSIS — E538 Deficiency of other specified B group vitamins: Secondary | ICD-10-CM | POA: Diagnosis not present

## 2024-03-07 MED ORDER — CYANOCOBALAMIN 1000 MCG/ML IJ SOLN
1000.0000 ug | Freq: Once | INTRAMUSCULAR | Status: AC
  Administered 2024-03-07: 1000 ug via INTRAMUSCULAR

## 2024-03-07 NOTE — Progress Notes (Cosign Needed Addendum)
 After obtaining consent, and per orders of [place], Vickie, NP-C , injection of B12 given by Ronnald SHAUNNA Palms. Patient instructed to report any adverse reaction to me immediately.   Medical screening examination/treatment/procedure(s) were performed by non-physician practitioner and as supervising physician I was immediately available for consultation/collaboration.  I agree with above. Karlynn Noel, MD

## 2024-03-14 ENCOUNTER — Ambulatory Visit

## 2024-03-14 LAB — ZINC: Zinc: 73 ug/dL (ref 60–130)

## 2024-03-16 ENCOUNTER — Encounter (INDEPENDENT_AMBULATORY_CARE_PROVIDER_SITE_OTHER): Payer: Self-pay

## 2024-04-26 ENCOUNTER — Institutional Professional Consult (permissible substitution) (INDEPENDENT_AMBULATORY_CARE_PROVIDER_SITE_OTHER)
